# Patient Record
Sex: Female | Born: 1965 | Race: Black or African American | Hispanic: No | Marital: Married | Smoking: Never smoker
Health system: Southern US, Community
[De-identification: ages and names within clinical notes are randomized; demographics above are authoritative.]

## PROBLEM LIST (undated history)

## (undated) DIAGNOSIS — G43909 Migraine, unspecified, not intractable, without status migrainosus: Secondary | ICD-10-CM

## (undated) DIAGNOSIS — M1990 Unspecified osteoarthritis, unspecified site: Secondary | ICD-10-CM

## (undated) DIAGNOSIS — I10 Essential (primary) hypertension: Secondary | ICD-10-CM

## (undated) DIAGNOSIS — E669 Obesity, unspecified: Secondary | ICD-10-CM

## (undated) DIAGNOSIS — M1712 Unilateral primary osteoarthritis, left knee: Secondary | ICD-10-CM

## (undated) HISTORY — DX: Obesity, unspecified: E66.9

## (undated) HISTORY — DX: Essential (primary) hypertension: I10

## (undated) HISTORY — PX: ABDOMINAL HYSTERECTOMY: SHX81

---
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
## 2004-12-19 ENCOUNTER — Emergency Department (HOSPITAL_COMMUNITY): Admission: EM | Admit: 2004-12-19 | Discharge: 2004-12-19 | Payer: Self-pay | Admitting: Family Medicine

## 2006-08-31 ENCOUNTER — Ambulatory Visit (HOSPITAL_COMMUNITY): Admission: RE | Admit: 2006-08-31 | Discharge: 2006-08-31 | Payer: Self-pay | Admitting: Obstetrics & Gynecology

## 2007-06-10 ENCOUNTER — Ambulatory Visit: Payer: Self-pay

## 2008-03-13 ENCOUNTER — Emergency Department (HOSPITAL_COMMUNITY): Admission: EM | Admit: 2008-03-13 | Discharge: 2008-03-13 | Payer: Self-pay | Admitting: Family Medicine

## 2008-07-14 ENCOUNTER — Emergency Department (HOSPITAL_COMMUNITY): Admission: EM | Admit: 2008-07-14 | Discharge: 2008-07-14 | Payer: Self-pay | Admitting: Family Medicine

## 2010-06-09 ENCOUNTER — Encounter: Payer: Self-pay | Admitting: Obstetrics & Gynecology

## 2011-02-18 LAB — GLUCOSE, CAPILLARY: Glucose-Capillary: 76

## 2011-02-18 LAB — POCT CARDIAC MARKERS
CKMB, poc: 1.1
CKMB, poc: <1 — ABNORMAL LOW
Myoglobin, poc: 53.7
Myoglobin, poc: 62.3

## 2011-02-18 LAB — CBC
HCT: 38.9
Hemoglobin: 13.3
MCV: 84
Platelets: 355

## 2011-02-18 LAB — COMPREHENSIVE METABOLIC PANEL
Albumin: 3.9
Alkaline Phosphatase: 65
BUN: 13
Calcium: 9.5
Chloride: 100
Creatinine, Ser: 0.94
GFR calc Af Amer: >60
Glucose, Bld: 81
Potassium: 4.5
Sodium: 133 — ABNORMAL LOW
Total Bilirubin: 0.7

## 2011-02-18 LAB — DIFFERENTIAL
Eosinophils Relative: 0
Lymphocytes Relative: 51 — ABNORMAL HIGH
Lymphs Abs: 1.8

## 2012-11-03 ENCOUNTER — Other Ambulatory Visit: Payer: Self-pay | Admitting: Family Medicine

## 2012-11-03 DIAGNOSIS — M25561 Pain in right knee: Secondary | ICD-10-CM

## 2012-11-11 ENCOUNTER — Ambulatory Visit
Admission: RE | Admit: 2012-11-11 | Discharge: 2012-11-11 | Disposition: A | Discharge status: Home / Self Care | Payer: 59 | Source: Ambulatory Visit | Attending: Family Medicine | Admitting: Family Medicine

## 2012-11-11 DIAGNOSIS — M25561 Pain in right knee: Secondary | ICD-10-CM

## 2012-12-28 ENCOUNTER — Other Ambulatory Visit: Payer: Self-pay | Admitting: Obstetrics & Gynecology

## 2012-12-28 DIAGNOSIS — Z139 Encounter for screening, unspecified: Secondary | ICD-10-CM

## 2013-01-13 ENCOUNTER — Ambulatory Visit (INDEPENDENT_AMBULATORY_CARE_PROVIDER_SITE_OTHER): Payer: 59 | Admitting: Obstetrics & Gynecology

## 2013-01-13 ENCOUNTER — Encounter: Payer: Self-pay | Admitting: Obstetrics & Gynecology

## 2013-01-13 ENCOUNTER — Ambulatory Visit (HOSPITAL_COMMUNITY)
Admission: RE | Admit: 2013-01-13 | Discharge: 2013-01-13 | Disposition: A | Discharge status: Home / Self Care | Payer: 59 | Source: Ambulatory Visit | Attending: Obstetrics & Gynecology | Admitting: Obstetrics & Gynecology

## 2013-01-13 VITALS — BP 120/80 | Ht 63.0 in | Wt 249.0 lb

## 2013-01-13 DIAGNOSIS — Z01419 Encounter for gynecological examination (general) (routine) without abnormal findings: Secondary | ICD-10-CM

## 2013-01-13 DIAGNOSIS — Z139 Encounter for screening, unspecified: Secondary | ICD-10-CM

## 2013-01-13 DIAGNOSIS — Z1231 Encounter for screening mammogram for malignant neoplasm of breast: Secondary | ICD-10-CM | POA: Insufficient documentation

## 2013-01-13 NOTE — Progress Notes (Signed)
Patient ID: Tamara Malone, female   DOB: 12/15/1965, 47 y.o.   MRN: [id number] Subjective:     Tamara Malone is a 47 y.o. female here for a routine exam.  No LMP recorded. Patient has had a hysterectomy. No obstetric history on file. Current complaints: none.  Personal health questionnaire reviewed: no.   Gynecologic History No LMP recorded. Patient has had a hysterectomy. Contraception: status post hysterectomy Last Pap: 2001. Results were: na Last mammogram: 2014, today. Results were: pending  Obstetric History OB History  No data available     The following portions of the patient's history were reviewed and updated as appropriate: allergies, current medications, past family history, past medical history, past social history, past surgical history and problem list.  Review of Systems  Review of Systems  Constitutional: Negative for fever, chills, weight loss, malaise/fatigue and diaphoresis.  HENT: Negative for hearing loss, ear pain, nosebleeds, congestion, sore throat, neck pain, tinnitus and ear discharge.   Eyes: Negative for blurred vision, double vision, photophobia, pain, discharge and redness.  Respiratory: Negative for cough, hemoptysis, sputum production, shortness of breath, wheezing and stridor.   Cardiovascular: Negative for chest pain, palpitations, orthopnea, claudication, leg swelling and PND.  Gastrointestinal: negative for abdominal pain. Negative for heartburn, nausea, vomiting, diarrhea, constipation, blood in stool and melena.  Genitourinary: Negative for dysuria, urgency, frequency, hematuria and flank pain.  Musculoskeletal: Negative for myalgias, back pain, joint pain and falls.  Skin: Negative for itching and rash.  Neurological: Negative for dizziness, tingling, tremors, sensory change, speech change, focal weakness, seizures, loss of consciousness, weakness and headaches.  Endo/Heme/Allergies: Negative for environmental allergies and polydipsia. Does  not bruise/bleed easily.  Psychiatric/Behavioral: Negative for depression, suicidal ideas, hallucinations, memory loss and substance abuse. The patient is not nervous/anxious and does not have insomnia.        Objective:    Physical Exam  Vitals reviewed. Constitutional: She is oriented to person, place, and time. She appears well-developed and well-nourished.  HENT:  Head: Normocephalic and atraumatic.        Right Ear: External ear normal.  Left Ear: External ear normal.  Nose: Nose normal.  Mouth/Throat: Oropharynx is clear and moist.  Eyes: Conjunctivae and EOM are normal. Pupils are equal, round, and reactive to light. Right eye exhibits no discharge. Left eye exhibits no discharge. No scleral icterus.  Neck: Normal range of motion. Neck supple. No tracheal deviation present. No thyromegaly present.  Cardiovascular: Normal rate, regular rhythm, normal heart sounds and intact distal pulses.  Exam reveals no gallop and no friction rub.   No murmur heard. Respiratory: Effort normal and breath sounds normal. No respiratory distress. She has no wheezes. She has no rales. She exhibits no tenderness.  GI: Soft. Bowel sounds are normal. She exhibits no distension and no mass. There is no tenderness. There is no rebound and no guarding.  Genitourinary:       Vulva is normal without lesions Vagina is pink moist without discharge Cervix absent Uterus is absent Adnexa is negative with normal sized ovaries   Musculoskeletal: Normal range of motion. She exhibits no edema and no tenderness.  Neurological: She is alert and oriented to person, place, and time. She has normal reflexes. She displays normal reflexes. No cranial nerve deficit. She exhibits normal muscle tone. Coordination normal.  Skin: Skin is warm and dry. No rash noted. No erythema. No pallor.  Psychiatric: She has a normal mood and affect. Her behavior is normal. Judgment and  thought content normal.       Assessment:     Healthy female exam.    Plan:    Mammogram ordered. Follow up in: 1 year.

## 2014-07-13 ENCOUNTER — Encounter: Payer: 59 | Attending: Internal Medicine | Admitting: Dietician

## 2014-07-13 ENCOUNTER — Encounter: Payer: Self-pay | Admitting: Dietician

## 2014-07-13 DIAGNOSIS — Z6841 Body Mass Index (BMI) 40.0 and over, adult: Secondary | ICD-10-CM | POA: Diagnosis not present

## 2014-07-13 DIAGNOSIS — Z713 Dietary counseling and surveillance: Secondary | ICD-10-CM | POA: Insufficient documentation

## 2014-07-13 DIAGNOSIS — E669 Obesity, unspecified: Secondary | ICD-10-CM | POA: Diagnosis present

## 2014-07-13 NOTE — Patient Instructions (Addendum)
-  Eat 3 meals a day. -Try to eat something first thing in the morning: boiled eggs, protein shake (Premier protein, EASadvantage, Atkins), yogurt -Have protein with every meal and snack: boiled eggs, cheese, sticks, yogurt, nuts -Fill up on non-starchy vegetables -Have salad dressings on the side.  -Pre-portion snacks into small containers or ziploc bags and put away the container.  -For snacks, try non-starchy vegetables: broccoli, cauliflower, cucumbers, tomatoes with hummus or ranch made with greek yogurt and [employer]. -Try replacing mayonnaise with greek yogurt for chicken salad -Keep protein and starch servings about the size of the palm of your hand -Use a smaller plate  -Be as active as you can, start small!

## 2014-07-13 NOTE — Progress Notes (Signed)
  Medical Nutrition Therapy:  Appt start time: 215 end time:  250.   Assessment:  Primary concerns today: Kirstin is here today with the desire to lose weight. About 3 years ago, she states she focused on vegetables, fruit, and water, maybe splurging on weekends but lost about 30 lbs. She works second shift. She lives at home with her husband. She does the grocery shopping and meal preparation. She states she eats take out about three times a week, usually [employer]-fil[employer]a. She states she may skip 10 meals in a typical week, always breakfast.  She says she doesn't have much of an appetite during the day and usually eats once a day about 6:00pm. She has a treadmill at home but does not like to use it. She is thinking about joining a gym. She states she has had blood pressure problems in the past but does not currently.   Preferred Learning Style:   No preference indicated   Learning Readiness:   Contemplating   MEDICATIONS: see list   DIETARY INTAKE:  Usual eating pattern includes 1 meals and 1 snack per day.  Avoided foods include carrots, celery.    24-hr recall:  B ( AM): usually skips, sometimes bacon and eggs on the weekends  Snk ( AM): none L ( PM): skips Snk ( PM): none D ( 6 PM): lasagna, chicken salad, salad with tomatoes, cheese, and grilled chicken with [place] dressing Snk ( PM): peanut butter pretzels, sun chips Beverages: water  Usual physical activity: none  Estimated energy needs: 1800 calories 200 g carbohydrates 135 g protein 50 g fat  Progress Towards Goal(s):  In progress.   Nutritional Diagnosis:  NB-1.1 Food and nutrition-related knowledge deficit As related to meal skipping and inappropriate food choices.  As evidenced by diet recall.    Intervention:  Nutrition education provided. Encourage patient to eat 3 meals a day.  Goals: -Eat 3 meals a day. -Try to eat something first thing in the morning: boiled eggs, protein shake (Premier protein,  EASadvantage, Atkins), yogurt -Have protein with every meal and snack: boiled eggs, cheese, sticks, yogurt, nuts -Fill up on non-starchy vegetables -Have salad dressings on the side.  -Pre-portion snacks into small containers or ziploc bags and put away the container.  -For snacks, try non-starchy vegetables: broccoli, cauliflower, cucumbers, tomatoes with hummus or ranch made with greek yogurt and [employer]. -Try replacing mayonnaise with greek yogurt for chicken salad -Keep protein and starch servings about the size of the palm of your hand -Use a smaller plate  -Be as active as you can, start small!   Teaching Method Utilized:  Visual Auditory Hands on  Handouts given during visit include:  Meal planning card  My Plate  Snack list  Barriers to learning/adherence to lifestyle change: patient works second shift  Demonstrated degree of understanding via:  Teach Back   Monitoring/Evaluation:  Dietary intake, exercise, and body weight prn.

## 2015-11-29 ENCOUNTER — Other Ambulatory Visit: Payer: Self-pay | Admitting: Orthopedic Surgery

## 2015-11-29 DIAGNOSIS — M25571 Pain in right ankle and joints of right foot: Secondary | ICD-10-CM

## 2015-12-10 ENCOUNTER — Ambulatory Visit
Admission: RE | Admit: 2015-12-10 | Discharge: 2015-12-10 | Disposition: A | Discharge status: Home / Self Care | Payer: 59 | Source: Ambulatory Visit | Attending: Orthopedic Surgery | Admitting: Orthopedic Surgery

## 2015-12-10 DIAGNOSIS — M25571 Pain in right ankle and joints of right foot: Secondary | ICD-10-CM

## 2016-01-03 ENCOUNTER — Ambulatory Visit (INDEPENDENT_AMBULATORY_CARE_PROVIDER_SITE_OTHER): Payer: 59 | Admitting: Podiatry

## 2016-01-03 ENCOUNTER — Encounter: Payer: Self-pay | Admitting: Podiatry

## 2016-01-03 ENCOUNTER — Ambulatory Visit (INDEPENDENT_AMBULATORY_CARE_PROVIDER_SITE_OTHER): Payer: 59

## 2016-01-03 ENCOUNTER — Ambulatory Visit: Payer: Self-pay

## 2016-01-03 VITALS — BP 139/88 | HR 90 | Resp 16 | Ht 64.0 in | Wt 265.0 lb

## 2016-01-03 DIAGNOSIS — M779 Enthesopathy, unspecified: Secondary | ICD-10-CM

## 2016-01-03 DIAGNOSIS — M79672 Pain in left foot: Secondary | ICD-10-CM

## 2016-01-03 DIAGNOSIS — M25571 Pain in right ankle and joints of right foot: Secondary | ICD-10-CM | POA: Diagnosis not present

## 2016-01-03 MED ORDER — TRIAMCINOLONE ACETONIDE 10 MG/ML IJ SUSP
10.0000 mg | Freq: Once | INTRAMUSCULAR | Status: AC
  Administered 2016-01-03: 10 mg

## 2016-01-03 NOTE — Progress Notes (Signed)
   Subjective:    Patient ID: Tamara Malone, female    DOB: Feb 16, 1966, 50 y.o.   MRN: [id number]  HPI Chief Complaint  Patient presents with  . Ankle Pain    Right foot; medial side; pt stated, "Pain radiates from the medial side to underneath the foot in the arch area"; x2 months   Pt saw Dr. Sharol Given from [facility] on December 20, 2015 and was given a boot and diclofenac cream for foot pain. Neither one of these are working for the patient. Pt was told by doctor that she had, "A torn tendon".    Review of Systems     Objective:   Physical Exam        Assessment & Plan:

## 2016-01-04 NOTE — Progress Notes (Signed)
Subjective:     Patient ID: Tamara Malone, female   DOB: 04-13-1966, 50 y.o.   MRN: [id number]  HPI patient points to the right foot stating the inside of the ankle has been very sore and it's been going on for several months. sHe is seen another physician that is not been helpful and she states that she has difficulty working.   Review of Systems  All other systems reviewed and are negative.      Objective:   Physical Exam  Constitutional: She is oriented to person, place, and time.  Cardiovascular: Intact distal pulses.   Musculoskeletal: Normal range of motion.  Neurological: She is oriented to person, place, and time.  Skin: Skin is warm.  Nursing note and vitals reviewed.  neurovascular status intact muscle strength was found to be adequate and posterior tibial muscle right was found to be intact with inflammation and pain of the posterior tibial tendon around 3 cm proximal to the navicular and inserting into this bone. 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 Patient had quite a bit of discomfort when the areas pressed and does have good digital perfusion and is well oriented 3     Assessment:     Inflammatory tendinitis with fluid buildup around the posterior tibial tendon with foot function is the major cause of this problem    Plan:     H&P and education rendered concerning condition. Today I went ahead and did a sheath injection of posterior tib and applied a fascial brace to lift the arch and discussed on using her boot at home. Her MRI does indicate tenosynovium right is we will need to watch this and it is ultimately possible she will need some kind of AFO brace and also may require surgery if symptoms persist. I did also today go ahead and cast her for a Berkley type orthotic with deep heel seat and 10 of lift to try to take stress off the tendon  X-ray report indicates that there is significant  collapse medial longitudinal arch right over left with quite a bit of talar declination angle increase

## 2016-01-24 ENCOUNTER — Encounter: Payer: Self-pay | Admitting: Podiatry

## 2016-01-24 ENCOUNTER — Ambulatory Visit (INDEPENDENT_AMBULATORY_CARE_PROVIDER_SITE_OTHER): Payer: 59 | Admitting: Podiatry

## 2016-01-24 DIAGNOSIS — M779 Enthesopathy, unspecified: Secondary | ICD-10-CM | POA: Diagnosis not present

## 2016-01-24 NOTE — Patient Instructions (Signed)

## 2016-01-27 NOTE — Progress Notes (Signed)
Subjective:     Patient ID: Tamara Malone, female   DOB: May 20, 1965, 50 y.o.   MRN: [id number]  HPI patient states that I'm feeling somewhat better with continued discomfort if I been walking too much   Review of Systems     Objective:   Physical Exam Neurovascular status intact with continued tendinitis that has improved moderately with mild discomfort upon deep palpation    Assessment:     Improving tendinitis with pain still present    Plan:     Dispensed orthotics with instructions on usage advised on ice and reappoint to recheck again in the next 4 weeks

## 2016-02-15 ENCOUNTER — Other Ambulatory Visit: Payer: Self-pay | Admitting: Internal Medicine

## 2016-02-15 DIAGNOSIS — G43009 Migraine without aura, not intractable, without status migrainosus: Secondary | ICD-10-CM

## 2016-02-21 ENCOUNTER — Ambulatory Visit (INDEPENDENT_AMBULATORY_CARE_PROVIDER_SITE_OTHER): Payer: 59 | Admitting: Podiatry

## 2016-02-21 ENCOUNTER — Encounter: Payer: Self-pay | Admitting: Podiatry

## 2016-02-21 DIAGNOSIS — M25571 Pain in right ankle and joints of right foot: Secondary | ICD-10-CM | POA: Diagnosis not present

## 2016-02-21 DIAGNOSIS — M779 Enthesopathy, unspecified: Secondary | ICD-10-CM

## 2016-02-24 NOTE — Progress Notes (Signed)
Subjective:     Patient ID: Tamara Malone, female   DOB: 10/16/1965, 50 y.o.   MRN: [id number]  HPI patient presents stating I'm feeling quite a bit better with minimal discomfort   Review of Systems     Objective:   Physical Exam Neurovascular status intact with minimal discomfort plantar heel right with inflammation still present but localized in nature    Assessment:     Plantar fasciitis right improved but present    Plan:     Instructed on the importance of physical therapy shoe gear modifications and choosing activities appropriately. Continued diclofenac gel as needed and reappoint as symptoms indicate

## 2016-03-08 ENCOUNTER — Ambulatory Visit
Admission: RE | Admit: 2016-03-08 | Discharge: 2016-03-08 | Disposition: A | Discharge status: Home / Self Care | Payer: 59 | Source: Ambulatory Visit | Attending: Internal Medicine | Admitting: Internal Medicine

## 2016-03-08 DIAGNOSIS — G43009 Migraine without aura, not intractable, without status migrainosus: Secondary | ICD-10-CM

## 2016-03-08 MED ORDER — GADOBENATE DIMEGLUMINE 529 MG/ML IV SOLN
20.0000 mL | Freq: Once | INTRAVENOUS | Status: AC | PRN
  Administered 2016-03-08: 20 mL via INTRAVENOUS

## 2016-03-17 ENCOUNTER — Ambulatory Visit
Admission: RE | Admit: 2016-03-17 | Discharge: 2016-03-17 | Disposition: A | Discharge status: Home / Self Care | Payer: Self-pay | Source: Ambulatory Visit | Attending: Internal Medicine | Admitting: Internal Medicine

## 2016-03-17 ENCOUNTER — Other Ambulatory Visit: Payer: Self-pay | Admitting: Internal Medicine

## 2016-03-17 DIAGNOSIS — R52 Pain, unspecified: Secondary | ICD-10-CM

## 2016-03-19 ENCOUNTER — Encounter: Payer: Self-pay | Admitting: Gastroenterology

## 2016-05-23 ENCOUNTER — Ambulatory Visit (AMBULATORY_SURGERY_CENTER): Payer: Self-pay | Admitting: *Deleted

## 2016-05-23 ENCOUNTER — Other Ambulatory Visit: Payer: Self-pay

## 2016-05-23 VITALS — Ht 64.0 in | Wt 269.2 lb

## 2016-05-23 DIAGNOSIS — Z1211 Encounter for screening for malignant neoplasm of colon: Secondary | ICD-10-CM

## 2016-05-23 MED ORDER — NA SULFATE-K SULFATE-MG SULF 17.5-3.13-1.6 GM/177ML PO SOLN: ORAL | 0 refills | Status: DC

## 2016-05-23 NOTE — Progress Notes (Signed)
Pt denies allergies to eggs or soy products. Denies difficulty with sedation or anesthesia. Denies any diet or weight loss medications. Denies use of supplemental oxygen.  Emmi instructions given for procedure.  

## 2016-06-06 ENCOUNTER — Encounter: Payer: Self-pay | Admitting: Gastroenterology

## 2016-06-06 ENCOUNTER — Ambulatory Visit (AMBULATORY_SURGERY_CENTER): Payer: 59 | Admitting: Gastroenterology

## 2016-06-06 VITALS — BP 136/87 | HR 64 | Temp 97.1°F | Resp 10 | Ht 64.0 in | Wt 269.0 lb

## 2016-06-06 DIAGNOSIS — K621 Rectal polyp: Secondary | ICD-10-CM

## 2016-06-06 DIAGNOSIS — Z1211 Encounter for screening for malignant neoplasm of colon: Secondary | ICD-10-CM

## 2016-06-06 DIAGNOSIS — D128 Benign neoplasm of rectum: Secondary | ICD-10-CM

## 2016-06-06 DIAGNOSIS — Z1212 Encounter for screening for malignant neoplasm of rectum: Secondary | ICD-10-CM

## 2016-06-06 MED ORDER — SODIUM CHLORIDE 0.9 % IV SOLN: 500.0000 mL | INTRAVENOUS | Status: DC

## 2016-06-06 NOTE — Patient Instructions (Signed)
YOU HAD AN ENDOSCOPIC PROCEDURE TODAY AT [facility]:   Refer to the procedure report that was given to you for any specific questions about what was found during the examination.  If the procedure report does not answer your questions, please call your gastroenterologist to clarify.  If you requested that your care partner not be given the details of your procedure findings, then the procedure report has been included in a sealed envelope for you to review at your convenience later.  YOU SHOULD EXPECT: Some feelings of bloating in the abdomen. Passage of more gas than usual.  Walking can help get rid of the air that was put into your GI tract during the procedure and reduce the bloating. If you had a lower endoscopy (such as a colonoscopy or flexible sigmoidoscopy) you may notice spotting of blood in your stool or on the toilet paper. If you underwent a bowel prep for your procedure, you may not have a normal bowel movement for a few days.  Please Note:  You might notice some irritation and congestion in your nose or some drainage.  This is from the oxygen used during your procedure.  There is no need for concern and it should clear up in a day or so.  SYMPTOMS TO REPORT IMMEDIATELY:   Following lower endoscopy (colonoscopy or flexible sigmoidoscopy):  Excessive amounts of blood in the stool  Significant tenderness or worsening of abdominal pains  Swelling of the abdomen that is new, acute  Fever of 100F or higher   Following upper endoscopy (EGD)  Vomiting of blood or coffee ground material  New chest pain or pain under the shoulder blades  Painful or persistently difficult swallowing  New shortness of breath  Fever of 100F or higher  Black, tarry-looking stools  For urgent or emergent issues, a gastroenterologist can be reached at any hour by calling [phone].   DIET:  We do recommend a small meal at first, but then you may proceed to your regular diet.  Drink  plenty of fluids but you should avoid alcoholic beverages for 24 hours.  ACTIVITY:  You should plan to take it easy for the rest of today and you should NOT DRIVE or use heavy machinery until tomorrow (because of the sedation medicines used during the test).    FOLLOW UP: Our staff will call the number listed on your records the next business day following your procedure to check on you and address any questions or concerns that you may have regarding the information given to you following your procedure. If we do not reach you, we will leave a message.  However, if you are feeling well and you are not experiencing any problems, there is no need to return our call.  We will assume that you have returned to your regular daily activities without incident.  If any biopsies were taken you will be contacted by phone or by letter within the next 1-3 weeks.  Please call us at [phone] if you have not heard about the biopsies in 3 weeks.    SIGNATURES/CONFIDENTIALITY: You and/or your care partner have signed paperwork which will be entered into your electronic medical record.  These signatures attest to the fact that that the information above on your After Visit Summary has been reviewed and is understood.  Full responsibility of the confidentiality of this discharge information lies with you and/or your care-partner.    Handouts were given to your care partner on polyps  and hemorrhoids. You may resume your current medications today. Await biopsy results. Please call if any questions or concerns.

## 2016-06-06 NOTE — Op Note (Signed)
[facility] Patient Name: Tamara Malone Procedure Date: 06/06/2016 11:40 AM MRN: [id number] Endoscopist: Mallie Mussel L. Loletha Carrow , MD Age: 51 Referring MD:  Date of Birth: 1965-12-14 Gender: Female Account #: [id number] Procedure:                Colonoscopy Indications:              Screening for colorectal malignant neoplasm, This                            is the patient's first colonoscopy Medicines:                Monitored Anesthesia Care Procedure:                Pre-Anesthesia Assessment:                           - Prior to the procedure, a History and Physical                            was performed, and patient medications and                            allergies were reviewed. The patient's tolerance of                            previous anesthesia was also reviewed. The risks                            and benefits of the procedure and the sedation                            options and risks were discussed with the patient.                            All questions were answered, and informed consent                            was obtained. Prior Anticoagulants: The patient has                            taken no previous anticoagulant or antiplatelet                            agents. ASA Grade Assessment: II - A patient with                            mild systemic disease. After reviewing the risks                            and benefits, the patient was deemed in                            satisfactory condition to undergo the procedure.  After obtaining informed consent, the colonoscope                            was passed under direct vision. Throughout the                            procedure, the patient's blood pressure, pulse, and                            oxygen saturations were monitored continuously. The                            Model PCF-H190L [id number]) scope was introduced                            through the anus and  advanced to the the cecum,                            identified by appendiceal orifice and ileocecal                            valve. The colonoscopy was performed without                            difficulty. The patient tolerated the procedure                            well. The quality of the bowel preparation was                            excellent. The ileocecal valve, appendiceal                            orifice, and rectum were photographed. The quality                            of the bowel preparation was evaluated using the                            BBPS [facility] Bowel Preparation Scale) with scores                            of: Right Colon = 2, Transverse Colon = 3 and Left                            Colon = 3. The total BBPS score equals 8. The bowel                            preparation used was SUPREP. Scope In: [id number] AM Scope Out: 12:03:09 PM Scope Withdrawal Time: 0 hours 14 minutes 51 seconds  Total Procedure Duration: 0 hours 16 minutes 50 seconds  Findings:                 The perianal  and digital rectal examinations were                            normal.                           Two sessile polyps were found in the rectum. The                            polyps were 2 to 4 mm in size. These polyps were                            removed with a cold snare. Resection and retrieval                            were complete.                           Internal hemorrhoids were found during retroflexion                            and during anoscopy. The hemorrhoids were small and                            Grade I (internal hemorrhoids that do not prolapse).                           The exam was otherwise without abnormality on                            direct and retroflexion views. Complications:            No immediate complications. Estimated Blood Loss:     Estimated blood loss: none. Impression:               - Two 2 to 4 mm polyps in the rectum,  removed with                            a cold snare. Resected and retrieved.                           - Internal hemorrhoids.                           - The examination was otherwise normal on direct                            and retroflexion views. Recommendation:           - Patient has a contact number available for                            emergencies. The signs and symptoms of potential                            delayed complications were discussed  with the                            patient. Return to normal activities tomorrow.                            Written discharge instructions were provided to the                            patient.                           - Resume previous diet.                           - Continue present medications.                           - Await pathology results.                           - Repeat colonoscopy is recommended for                            surveillance. The colonoscopy date will be                            determined after pathology results from today's                            exam become available for review. Henry L. Loletha Carrow, MD 06/06/2016 12:07:05 PM This report has been signed electronically.

## 2016-06-06 NOTE — Progress Notes (Signed)
To recovery vss report to RN 

## 2016-06-06 NOTE — Progress Notes (Signed)
Called to room to assist during endoscopic procedure.  Patient ID and intended procedure confirmed with present staff. Received instructions for my participation in the procedure from the performing physician.  

## 2016-06-06 NOTE — Progress Notes (Signed)
No problems noted in the recovery room. maw 

## 2016-06-09 ENCOUNTER — Telehealth: Payer: Self-pay | Admitting: *Deleted

## 2016-06-09 ENCOUNTER — Telehealth: Payer: Self-pay

## 2016-06-09 NOTE — Telephone Encounter (Signed)
No answer left message will attempt to call back later this afternoon. SM 

## 2016-06-09 NOTE — Telephone Encounter (Signed)
  Follow up Call-  Call back number 06/06/2016  Post procedure Call Back phone  # [phone]  Permission to leave phone message Yes  Some recent data might be hidden    Patient does not have concerns about her care. Yes was checked in error.   Patient questions:  Do you have a fever, pain , or abdominal swelling? No. Pain Score  0 *  Have you tolerated food without any problems? Yes.    Have you been able to return to your normal activities? Yes.    Do you have any questions about your discharge instructions: Diet   No. Medications  No. Follow up visit  No.  Do you have questions or concerns about your Care? Yes.    Actions: * If pain score is 4 or above: No action needed, pain <4.

## 2016-06-10 ENCOUNTER — Encounter: Payer: Self-pay | Admitting: Gastroenterology

## 2016-06-15 ENCOUNTER — Encounter: Payer: Self-pay | Admitting: Gastroenterology

## 2016-12-14 IMAGING — MR MR ANKLE*R* W/O CM
5 series · 40 of 40 positions shown · non-contrast
Comparison: Radiographs dated 11/28/2015

CLINICAL DATA: Medial right heel and arch pain and swelling for 5
weeks.

EXAM:
MRI OF THE RIGHT ANKLE WITHOUT CONTRAST
TECHNIQUE: Multiplanar, multisequence MR imaging of the ankle was performed. No
intravenous contrast was administered.

[Series 4: T2 fat-sat · axial · 3.0mm · 0.56mm/px · z∈[-90,+33]mm · 9 of 33 slices shown (1 of 3)]
[im 1/33]
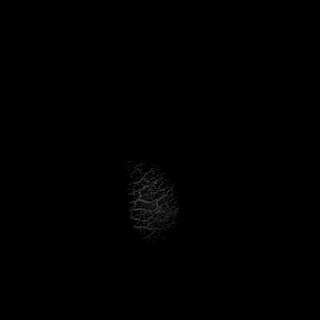
[im 5/33]
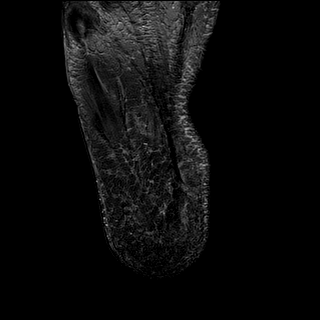
[im 9/33]
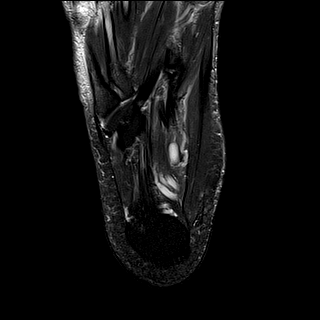
[im 13/33]
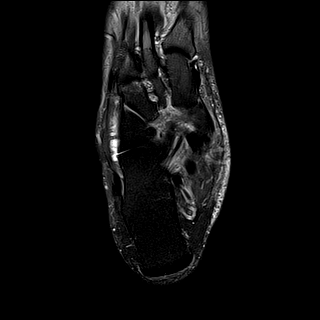
[im 17/33]
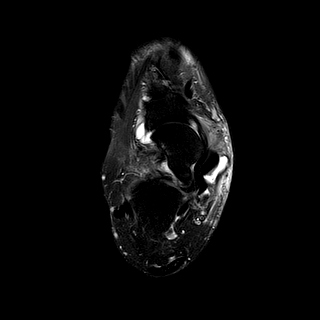
[im 21/33]
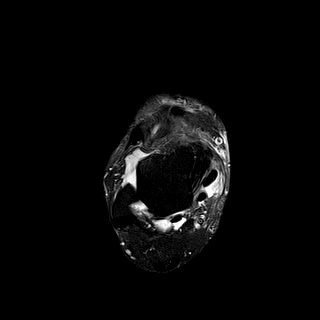
[im 25/33]
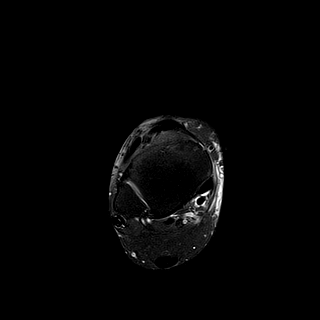
[im 29/33]
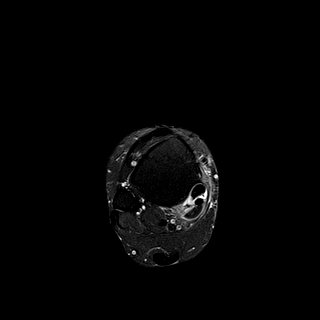
[im 33/33]
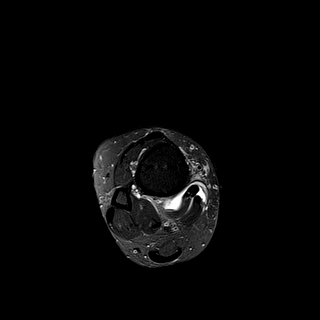

[Series 6: T1 · sagittal · 3.0mm · 0.56mm/px · 7 of 25 slices shown]
[im 1/25]
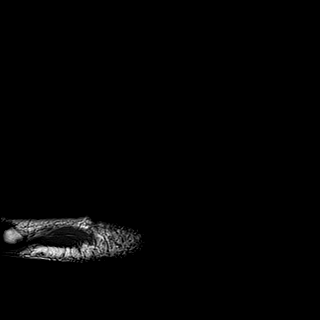
[im 5/25]
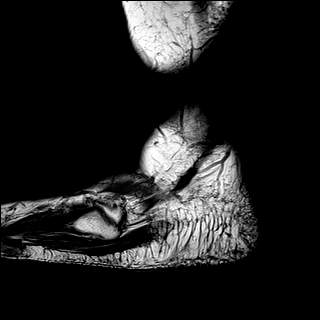
[im 9/25]
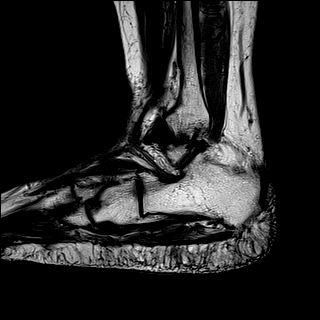
[im 13/25]
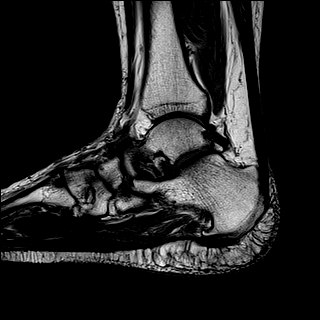
[im 17/25]
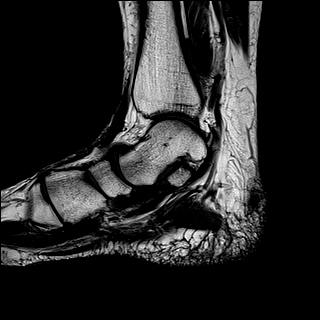
[im 21/25]
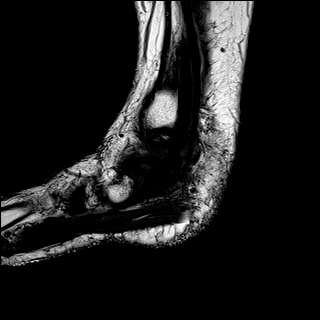
[im 25/25]
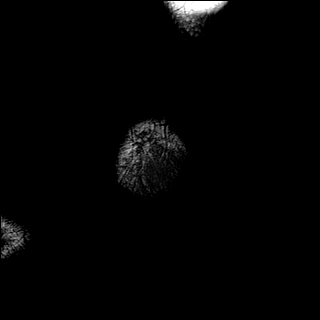

[Series 7: T2 fat-sat · sagittal · 3.0mm · 0.56mm/px · 7 of 25 slices shown (2 of 3)]
[im 1/25]
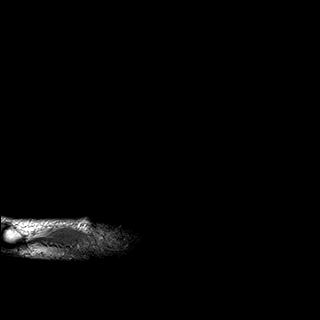
[im 5/25]
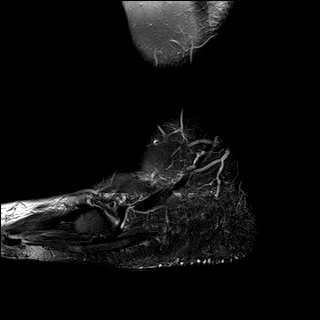
[im 9/25]
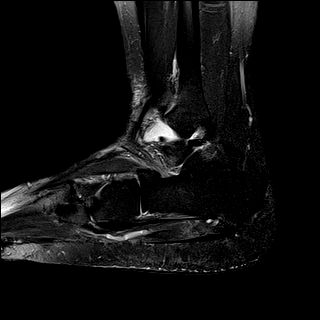
[im 13/25]
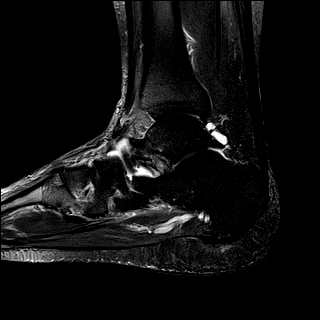
[im 17/25]
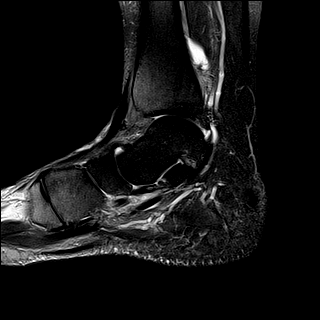
[im 21/25]
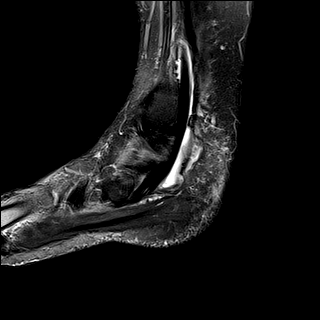
[im 25/25]
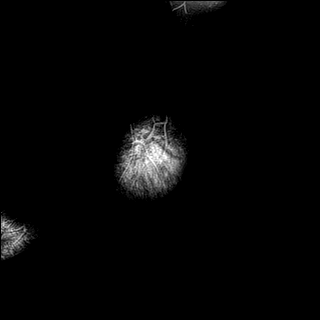

[Series 8: T2 fat-sat · coronal · 3.5mm · 0.56mm/px · 8 of 28 slices shown (3 of 3)]
[im 1/28]
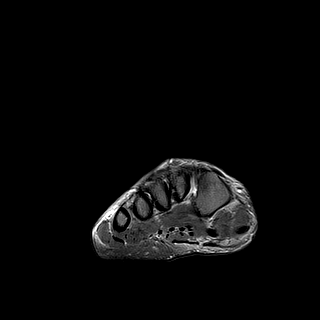
[im 4/28]
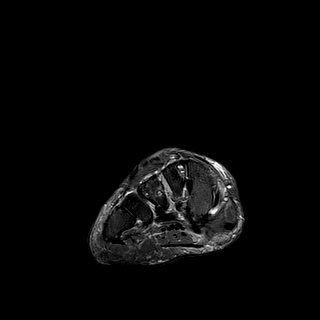
[im 8/28]
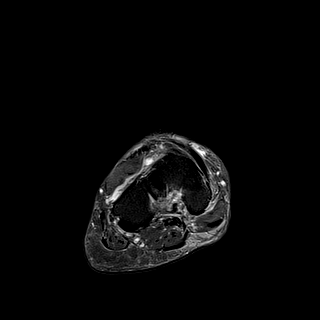
[im 12/28]
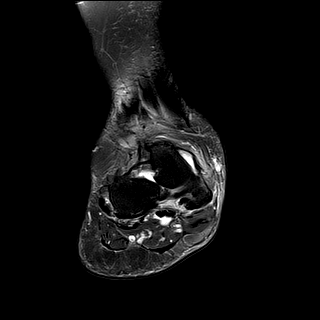
[im 16/28]
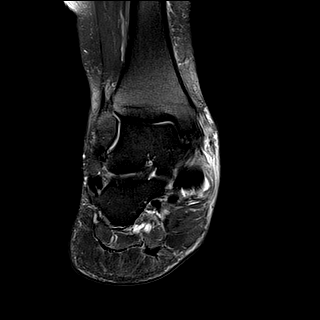
[im 20/28]
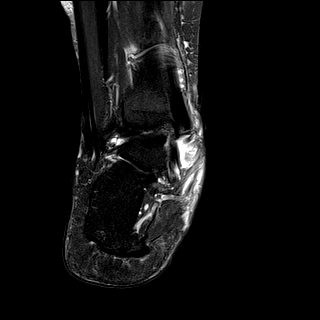
[im 24/28]
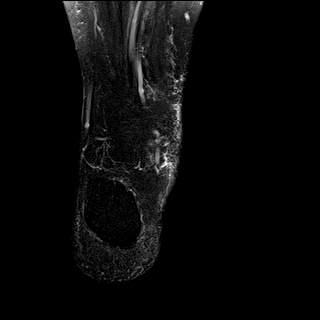
[im 28/28]
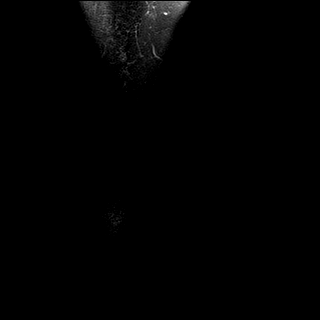

[Series 9: PD fat-sat · axial · 3.0mm · 0.56mm/px · z∈[-90,+33]mm · 9 of 33 slices shown]
[im 1/33]
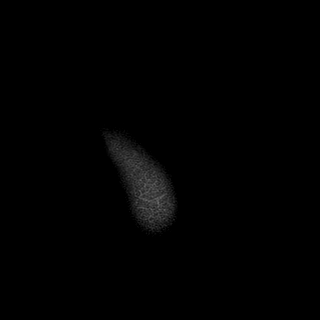
[im 5/33]
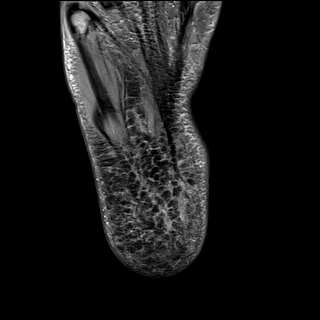
[im 9/33]
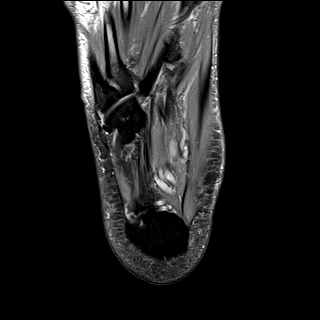
[im 13/33]
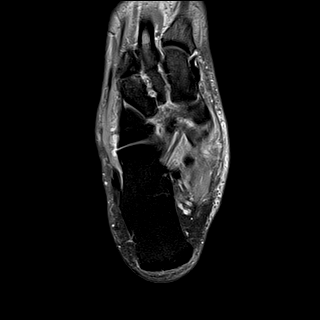
[im 17/33]
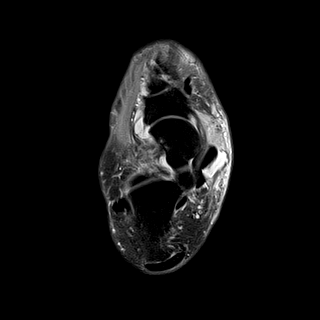
[im 21/33]
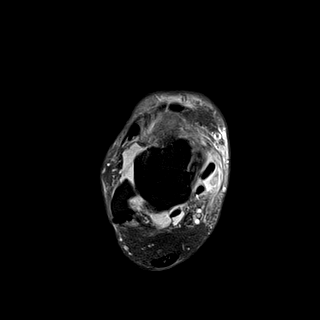
[im 25/33]
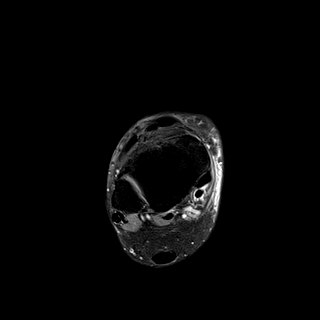
[im 29/33]
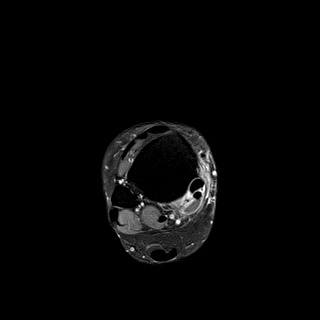
[im 33/33]
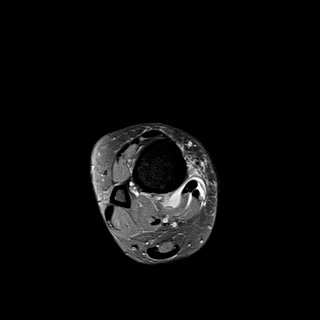

[40 of 40 positions shown; findings below may reference images not displayed]

FINDINGS: TENDONS

Peroneal: Normal.

Posteromedial: Marked tenosynovitis of the posterior tibialis
tendon. The tendon is intact there is extensive fluid and some
debris in the tendon sheath. Small amount of fluid in the sheath of
the flexor digitorum longus tendon. Flexor hallucis longus appears
normal.

Anterior: Normal.

Achilles: Minimal intrinsic degeneration of the distal Achilles
tendon.

Plantar Fascia: Slight thickening of the proximal medial band of the
plantar fascia. No active inflammation.

LIGAMENTS

Lateral: Normal.

Medial: Thickened but intact. Old small avulsions from the tip of
the medial malleolus.

CARTILAGE

Ankle Joint: Small ankle effusion.

Subtalar Joints/Sinus Tarsi: Small subtalar joint effusion.
Ligaments of the sinus tarsi are intact.

Bones: Slight osteoarthritis of the dorsal aspect of the first,
second and third tarsal metatarsal joints.

Other: None
IMPRESSION: Prominent tenosynovitis of the posterior tibialis tendon.

Chronic posttraumatic changes of the deltoid ligament.  Low

## 2019-01-21 NOTE — Patient Instructions (Addendum)
DUE TO COVID-19 ONLY ONE VISITOR IS ALLOWED TO COME WITH YOU AND STAY IN THE WAITING ROOM ONLY DURING PRE OP AND PROCEDURE DAY OF SURGERY. THE 1 VISITOR MAY VISIT WITH YOU AFTER SURGERY IN YOUR PRIVATE ROOM DURING VISITING HOURS ONLY!  YOU NEED TO HAVE A COVID 19 TEST ON____9-11-2020___ @__10 :00AM_____, THIS TEST MUST BE DONE BEFORE SURGERY, COME  Tamara Malone , [id number].  ([place]) ONCE YOUR COVID TEST IS COMPLETED, PLEASE BEGIN THE QUARANTINE INSTRUCTIONS AS OUTLINED IN YOUR HANDOUT.                Tamara Malone   Your procedure is scheduled on: 02-01-2019   Report to [facility] Main  Entrance   Report to [facility] at 5:30AM     Call this number if you have problems the morning of surgery [place], NO CHEWING GUM CANDY OR MINTS.    Do not eat food After Midnight. YOU MAY HAVE CLEAR LIQUIDS FROM MIDNIGHT UNTIL 4:30AM. At 4:30AM Please finish the prescribed Pre-Surgery ENSURE drink. Nothing by mouth after you finish the ENSURE drink !   CLEAR LIQUID DIET   Foods Allowed                                                                     Foods Excluded  Coffee and tea, regular and decaf                             liquids that you cannot  Plain Jell-O any favor except red or purple                                           see through such as: Fruit ices (not with fruit pulp)                                     milk, soups, orange juice  Iced Popsicles                                    All solid food Carbonated beverages, regular and diet                                    Cranberry, grape and apple juices Sports drinks like Gatorade Lightly seasoned clear broth or consume(fat free) Sugar, honey syrup  Sample Menu Breakfast                                Lunch  Supper Cranberry juice                    Beef broth                             Chicken broth Jell-O                                     Grape juice                           Apple juice Coffee or tea                        Jell-O                                      Popsicle                                                Coffee or tea                        Coffee or tea  _____________________________________________________________________    Take these medicines the morning of surgery with A SIP OF WATER: TOPIRAMATE                                  You may not have any metal on your body including hair pins and              piercings  Do not wear jewelry, make-up, lotions, powders or perfumes, deodorant             Do not wear nail polish.  Do not shave  48 hours prior to surgery.     Do not bring valuables to the hospital. [place].  Contacts, dentures or bridgework may not be worn into surgery.                Please read over the following fact sheets you were given: _____________________________________________________________________             [facility] - Preparing for Surgery Before surgery, you can play an important role.  Because skin is not sterile, your skin needs to be as free of germs as possible.  You can reduce the number of germs on your skin by washing with CHG (chlorahexidine gluconate) soap before surgery.  CHG is an antiseptic cleaner which kills germs and bonds with the skin to continue killing germs even after washing. Please DO NOT use if you have an allergy to CHG or antibacterial soaps.  If your skin becomes reddened/irritated stop using the CHG and inform your nurse when you arrive at Short Stay. Do not shave (including legs and underarms) for at least 48 hours prior to the first CHG shower.  You may shave your face/neck. Please follow these instructions carefully:  1.  Shower with CHG  Soap the night before surgery and the  morning of Surgery.  2.  If you choose to wash  your hair, wash your hair first as usual with your  normal  shampoo.  3.  After you shampoo, rinse your hair and body thoroughly to remove the  shampoo.                           4.  Use CHG as you would any other liquid soap.  You can apply chg directly  to the skin and wash                       Gently with a scrungie or clean washcloth.  5.  Apply the CHG Soap to your body ONLY FROM THE NECK DOWN.   Do not use on face/ open                           Wound or open sores. Avoid contact with eyes, ears mouth and genitals (private parts).                       Wash face,  Genitals (private parts) with your normal soap.             6.  Wash thoroughly, paying special attention to the area where your surgery  will be performed.  7.  Thoroughly rinse your body with warm water from the neck down.  8.  DO NOT shower/wash with your normal soap after using and rinsing off  the CHG Soap.                9.  Pat yourself dry with a clean towel.            10.  Wear clean pajamas.            11.  Place clean sheets on your bed the night of your first shower and do not  sleep with pets. Day of Surgery : Do not apply any lotions/deodorants the morning of surgery.  Please wear clean clothes to the hospital/surgery center.  FAILURE TO FOLLOW THESE INSTRUCTIONS MAY RESULT IN THE CANCELLATION OF YOUR SURGERY PATIENT SIGNATURE_________________________________  NURSE SIGNATURE__________________________________  ________________________________________________________________________   Tamara Malone  An incentive spirometer is a tool that can help keep your lungs clear and active. This tool measures how well you are filling your lungs with each breath. Taking long deep breaths may help reverse or decrease the chance of developing breathing (pulmonary) problems (especially infection) following:  A long period of time when you are unable to move or be active. BEFORE THE PROCEDURE   If the spirometer  includes an indicator to show your best effort, your nurse or respiratory therapist will set it to a desired goal.  If possible, sit up straight or lean slightly forward. Try not to slouch.  Hold the incentive spirometer in an upright position. INSTRUCTIONS FOR USE  1. Sit on the edge of your bed if possible, or sit up as far as you can in bed or on a chair. 2. Hold the incentive spirometer in an upright position. 3. Breathe out normally. 4. Place the mouthpiece in your mouth and seal your lips tightly around it. 5. Breathe in slowly and as deeply as possible, raising the piston or the ball toward the top of the column. 6. Hold your breath  for 3-5 seconds or for as long as possible. Allow the piston or ball to fall to the bottom of the column. 7. Remove the mouthpiece from your mouth and breathe out normally. 8. Rest for a few seconds and repeat Steps 1 through 7 at least 10 times every 1-2 hours when you are awake. Take your time and take a few normal breaths between deep breaths. 9. The spirometer may include an indicator to show your best effort. Use the indicator as a goal to work toward during each repetition. 10. After each set of 10 deep breaths, practice coughing to be sure your lungs are clear. If you have an incision (the cut made at the time of surgery), support your incision when coughing by placing a pillow or rolled up towels firmly against it. Once you are able to get out of bed, walk around indoors and cough well. You may stop using the incentive spirometer when instructed by your caregiver.  RISKS AND COMPLICATIONS  Take your time so you do not get dizzy or light-headed.  If you are in pain, you may need to take or ask for pain medication before doing incentive spirometry. It is harder to take a deep breath if you are having pain. AFTER USE  Rest and breathe slowly and easily.  It can be helpful to keep track of a log of your progress. Your caregiver can provide you with a  simple table to help with this. If you are using the spirometer at home, follow these instructions: [place] IF:   You are having difficultly using the spirometer.  You have trouble using the spirometer as often as instructed.  Your pain medication is not giving enough relief while using the spirometer.  You develop fever of 100.5 F (38.1 C) or higher. SEEK IMMEDIATE MEDICAL CARE IF:   You cough up bloody sputum that had not been present before.  You develop fever of 102 F (38.9 C) or greater.  You develop worsening pain at or near the incision site. MAKE SURE YOU:   Understand these instructions.  Will watch your condition.  Will get help right away if you are not doing well or get worse. Document Released: 09/15/2006 Document Revised: 07/28/2011 Document Reviewed: 11/16/2006 [facility] Patient Information 2014 [place], [place].   ________________________________________________________________________

## 2019-01-25 ENCOUNTER — Other Ambulatory Visit: Payer: Self-pay

## 2019-01-25 ENCOUNTER — Encounter (HOSPITAL_COMMUNITY)
Admission: RE | Admit: 2019-01-25 | Discharge: 2019-01-25 | Disposition: A | Discharge status: Home / Self Care | Payer: 59 | Source: Ambulatory Visit | Attending: Orthopedic Surgery | Admitting: Orthopedic Surgery

## 2019-01-25 ENCOUNTER — Encounter (HOSPITAL_COMMUNITY): Payer: Self-pay

## 2019-01-25 DIAGNOSIS — Z01812 Encounter for preprocedural laboratory examination: Secondary | ICD-10-CM | POA: Diagnosis not present

## 2019-01-25 DIAGNOSIS — Z20828 Contact with and (suspected) exposure to other viral communicable diseases: Secondary | ICD-10-CM | POA: Diagnosis not present

## 2019-01-25 DIAGNOSIS — M1711 Unilateral primary osteoarthritis, right knee: Secondary | ICD-10-CM | POA: Diagnosis not present

## 2019-01-25 HISTORY — DX: Migraine, unspecified, not intractable, without status migrainosus: G43.909

## 2019-01-25 HISTORY — DX: Unspecified osteoarthritis, unspecified site: M19.90

## 2019-01-25 LAB — BASIC METABOLIC PANEL
Anion gap: 5 (ref 5–15)
BUN: 11 mg/dL (ref 6–20)
CO2: 29 mmol/L (ref 22–32)
Calcium: 9.3 mg/dL (ref 8.9–10.3)
Chloride: 108 mmol/L (ref 98–111)
Creatinine, Ser: 0.68 mg/dL (ref 0.44–1.00)
GFR calc Af Amer: >60 mL/min (ref >60)
GFR calc non Af Amer: >60 mL/min (ref >60)
Glucose, Bld: 89 mg/dL (ref 70–99)
Potassium: 4.6 mmol/L (ref 3.5–5.1)
Sodium: 142 mmol/L (ref 135–145)

## 2019-01-25 LAB — CBC
HCT: 39 % (ref 36.0–46.0)
Hemoglobin: 12.1 g/dL (ref 12.0–15.0)
MCH: 26.8 pg (ref 26.0–34.0)
MCHC: 31 g/dL (ref 30.0–36.0)
MCV: 86.5 fL (ref 80.0–100.0)
Platelets: 397 10*3/uL (ref 150–400)
RBC: 4.51 MIL/uL (ref 3.87–5.11)
RDW: 16.2 % — ABNORMAL HIGH (ref 11.5–15.5)
WBC: 4.1 10*3/uL (ref 4.0–10.5)
nRBC: 0 % (ref 0.0–0.2)

## 2019-01-25 LAB — SURGICAL PCR SCREEN
MRSA, PCR: NEGATIVE
Staphylococcus aureus: NEGATIVE

## 2019-01-25 NOTE — Progress Notes (Signed)
PCP - Tisovec, Fransico Him, MD Cardiologist -   Chest x-ray -  EKG - on chart 12-20-2018 from [facility] medical asso.  Stress Test -  ECHO -  Cardiac Cath -   Sleep Study -  CPAP -   Fasting Blood Sugar -  Checks Blood Sugar _____ times a day  Blood Thinner Instructions: Aspirin Instructions: Last Dose:  Anesthesia review:   Patient denies shortness of breath, fever, cough and chest pain at PAT appointment   Patient verbalized understanding of instructions that were given to them at the PAT appointment. Patient was also instructed that they will need to review over the PAT instructions again at home before surgery.

## 2019-01-25 NOTE — Progress Notes (Addendum)
Patient has deferred signing consent form until the day of surgery as she states she is supposed to be having her left knee done on 9-15 while Dr Luanna Cole consent order lists the right knee as the surgery site. RN called and lvmm for [facility] surgery scheduler Mamie Levers  [phone] to make aware of the above and to request Mardelle Matte address this .

## 2019-01-28 ENCOUNTER — Other Ambulatory Visit: Payer: Self-pay

## 2019-01-28 ENCOUNTER — Other Ambulatory Visit (HOSPITAL_COMMUNITY)
Admission: RE | Admit: 2019-01-28 | Discharge: 2019-01-28 | Disposition: A | Discharge status: Home / Self Care | Payer: 59 | Source: Ambulatory Visit | Attending: Orthopedic Surgery | Admitting: Orthopedic Surgery

## 2019-01-28 DIAGNOSIS — Z01812 Encounter for preprocedural laboratory examination: Secondary | ICD-10-CM | POA: Diagnosis not present

## 2019-01-30 LAB — NOVEL CORONAVIRUS, NAA (HOSP ORDER, SEND-OUT TO REF LAB; TAT 18-24 HRS): SARS-CoV-2, NAA: NOT DETECTED

## 2019-01-31 NOTE — Anesthesia Preprocedure Evaluation (Addendum)
Anesthesia Evaluation  Patient identified by MRN, date of birth, ID band Patient awake    Reviewed: Allergy & Precautions, H&P , NPO status , Patient's Chart, lab work & pertinent test results  Airway Mallampati: II  TM Distance: >3 FB Neck ROM: Full    Dental no notable dental hx. (+) Teeth Intact, Dental Advisory Given   Pulmonary neg pulmonary ROS,    Pulmonary exam normal breath sounds clear to auscultation       Cardiovascular Exercise Tolerance: Good hypertension, Pt. on medications  Rhythm:Regular Rate:Normal     Neuro/Psych  Headaches, negative psych ROS   GI/Hepatic negative GI ROS, Neg liver ROS,   Endo/Other  Morbid obesity  Renal/GU negative Renal ROS  negative genitourinary   Musculoskeletal  (+) Arthritis , Osteoarthritis,    Abdominal   Peds  Hematology negative hematology ROS (+)   Anesthesia Other Findings   Reproductive/Obstetrics negative OB ROS                            Anesthesia Physical Anesthesia Plan  ASA: III  Anesthesia Plan: Spinal and MAC   Post-op Pain Management:  Regional for Post-op pain   Induction: Intravenous  PONV Risk Score and Plan: 3 and Ondansetron, Dexamethasone, Propofol infusion and Midazolam  Airway Management Planned: Simple Face Mask  Additional Equipment:   Intra-op Plan:   Post-operative Plan:   Informed Consent: I have reviewed the patients History and Physical, chart, labs and discussed the procedure including the risks, benefits and alternatives for the proposed anesthesia with the patient or authorized representative who has indicated his/her understanding and acceptance.     Dental advisory given  Plan Discussed with: CRNA  Anesthesia Plan Comments:         Anesthesia Quick Evaluation

## 2019-01-31 NOTE — Progress Notes (Signed)
Pt aware of surgical time change for surgery scheduled 02/01/2019. Pt aware to arrive at [facility] admitting by 0730. To consume ERAS drink by 0700 otherwise no food after midnight and no liquid after 0700. Pt verbalized understanding. Also pt is aware to take medications as instructed per PST.

## 2019-02-01 ENCOUNTER — Encounter (HOSPITAL_COMMUNITY)
Admission: RE | Disposition: A | Discharge status: Home / Self Care | Payer: Self-pay | Source: Home / Self Care | Attending: Orthopedic Surgery

## 2019-02-01 ENCOUNTER — Observation Stay (HOSPITAL_COMMUNITY): Payer: 59

## 2019-02-01 ENCOUNTER — Ambulatory Visit (HOSPITAL_COMMUNITY): Payer: 59 | Admitting: Certified Registered Nurse Anesthetist

## 2019-02-01 ENCOUNTER — Ambulatory Visit (HOSPITAL_COMMUNITY): Payer: 59 | Admitting: Physician Assistant

## 2019-02-01 ENCOUNTER — Observation Stay (HOSPITAL_COMMUNITY)
Admission: RE | Admit: 2019-02-01 | Discharge: 2019-02-02 | Disposition: A | Discharge status: Home / Self Care | Payer: 59 | Attending: Orthopedic Surgery | Admitting: Orthopedic Surgery

## 2019-02-01 ENCOUNTER — Encounter (HOSPITAL_COMMUNITY): Payer: Self-pay

## 2019-02-01 ENCOUNTER — Other Ambulatory Visit: Payer: Self-pay

## 2019-02-01 DIAGNOSIS — G43909 Migraine, unspecified, not intractable, without status migrainosus: Secondary | ICD-10-CM | POA: Insufficient documentation

## 2019-02-01 DIAGNOSIS — Z1211 Encounter for screening for malignant neoplasm of colon: Secondary | ICD-10-CM

## 2019-02-01 DIAGNOSIS — M1712 Unilateral primary osteoarthritis, left knee: Principal | ICD-10-CM

## 2019-02-01 DIAGNOSIS — Z96652 Presence of left artificial knee joint: Secondary | ICD-10-CM

## 2019-02-01 DIAGNOSIS — Z6839 Body mass index (BMI) 39.0-39.9, adult: Secondary | ICD-10-CM | POA: Diagnosis not present

## 2019-02-01 DIAGNOSIS — M25562 Pain in left knee: Secondary | ICD-10-CM | POA: Diagnosis present

## 2019-02-01 DIAGNOSIS — Z79899 Other long term (current) drug therapy: Secondary | ICD-10-CM | POA: Diagnosis not present

## 2019-02-01 DIAGNOSIS — I10 Essential (primary) hypertension: Secondary | ICD-10-CM | POA: Diagnosis not present

## 2019-02-01 DIAGNOSIS — M25762 Osteophyte, left knee: Secondary | ICD-10-CM | POA: Insufficient documentation

## 2019-02-01 HISTORY — PX: PARTIAL KNEE ARTHROPLASTY: SHX2174

## 2019-02-01 HISTORY — DX: Unilateral primary osteoarthritis, left knee: M17.12

## 2019-02-01 SURGERY — ARTHROPLASTY, KNEE, UNICOMPARTMENTAL
Anesthesia: Monitor Anesthesia Care | Site: Knee | Laterality: Left

## 2019-02-01 MED ORDER — BUPIVACAINE IN DEXTROSE 0.75-8.25 % IT SOLN
INTRATHECAL | Status: DC | PRN
  Administered 2019-02-01: 1.6 mL via INTRATHECAL

## 2019-02-01 MED ORDER — BUPIVACAINE-EPINEPHRINE (PF) 0.5% -1:200000 IJ SOLN
INTRAMUSCULAR | Status: DC | PRN
  Administered 2019-02-01: 20 mL via PERINEURAL

## 2019-02-01 MED ORDER — PHENYLEPHRINE 40 MCG/ML (10ML) SYRINGE FOR IV PUSH (FOR BLOOD PRESSURE SUPPORT)
PREFILLED_SYRINGE | INTRAVENOUS | Status: AC
  Filled 2019-02-01: qty 10

## 2019-02-01 MED ORDER — PROPOFOL 10 MG/ML IV BOLUS
INTRAVENOUS | Status: AC
  Filled 2019-02-01: qty 20

## 2019-02-01 MED ORDER — METOCLOPRAMIDE HCL 5 MG/ML IJ SOLN: 5.0000 mg | Freq: Three times a day (TID) | INTRAMUSCULAR | Status: DC | PRN

## 2019-02-01 MED ORDER — TOPIRAMATE ER 25 MG PO SPRINKLE CAP24
150.0000 mg | EXTENDED_RELEASE_CAPSULE | Freq: Every day | ORAL | Status: DC
  Administered 2019-02-02: 150 mg via ORAL
  Filled 2019-02-01: qty 2

## 2019-02-01 MED ORDER — ONDANSETRON HCL 4 MG PO TABS: 4.0000 mg | ORAL_TABLET | Freq: Three times a day (TID) | ORAL | 0 refills | Status: DC | PRN

## 2019-02-01 MED ORDER — HYDROCODONE-ACETAMINOPHEN 5-325 MG PO TABS
1.0000 | ORAL_TABLET | ORAL | Status: DC | PRN
  Administered 2019-02-01 – 2019-02-02 (×2): 1 via ORAL
  Administered 2019-02-02 (×2): 2 via ORAL
  Filled 2019-02-01 (×2): qty 2
  Filled 2019-02-01 (×2): qty 1

## 2019-02-01 MED ORDER — ACETAMINOPHEN 500 MG PO TABS
1000.0000 mg | ORAL_TABLET | Freq: Once | ORAL | Status: AC
  Administered 2019-02-01: 1000 mg via ORAL
  Filled 2019-02-01: qty 2

## 2019-02-01 MED ORDER — MIDAZOLAM HCL 2 MG/2ML IJ SOLN
1.0000 mg | INTRAMUSCULAR | Status: DC
  Administered 2019-02-01: 2 mg via INTRAVENOUS
  Filled 2019-02-01: qty 2

## 2019-02-01 MED ORDER — ONDANSETRON HCL 4 MG PO TABS: 4.0000 mg | ORAL_TABLET | Freq: Four times a day (QID) | ORAL | Status: DC | PRN

## 2019-02-01 MED ORDER — BISACODYL 10 MG RE SUPP: 10.0000 mg | Freq: Every day | RECTAL | Status: DC | PRN

## 2019-02-01 MED ORDER — ACETAMINOPHEN 325 MG PO TABS: 325.0000 mg | ORAL_TABLET | Freq: Four times a day (QID) | ORAL | Status: DC | PRN

## 2019-02-01 MED ORDER — DEXAMETHASONE SODIUM PHOSPHATE 10 MG/ML IJ SOLN
INTRAMUSCULAR | Status: AC
  Filled 2019-02-01: qty 1

## 2019-02-01 MED ORDER — POLYETHYLENE GLYCOL 3350 17 G PO PACK: 17.0000 g | PACK | Freq: Every day | ORAL | Status: DC | PRN

## 2019-02-01 MED ORDER — POVIDONE-IODINE 10 % EX SWAB: 2.0000 "application " | Freq: Once | CUTANEOUS | Status: DC

## 2019-02-01 MED ORDER — ASPIRIN EC 325 MG PO TBEC: 325.0000 mg | DELAYED_RELEASE_TABLET | Freq: Two times a day (BID) | ORAL | 0 refills | Status: DC

## 2019-02-01 MED ORDER — SODIUM CHLORIDE 0.9 % IR SOLN
Status: DC | PRN
  Administered 2019-02-01: 1000 mL

## 2019-02-01 MED ORDER — CEFAZOLIN SODIUM-DEXTROSE 2-4 GM/100ML-% IV SOLN
2.0000 g | Freq: Four times a day (QID) | INTRAVENOUS | Status: AC
  Administered 2019-02-01 – 2019-02-02 (×2): 2 g via INTRAVENOUS
  Filled 2019-02-01 (×2): qty 100

## 2019-02-01 MED ORDER — STERILE WATER FOR IRRIGATION IR SOLN
Status: DC | PRN
  Administered 2019-02-01 (×2): 1000 mL

## 2019-02-01 MED ORDER — DOCUSATE SODIUM 100 MG PO CAPS
100.0000 mg | ORAL_CAPSULE | Freq: Two times a day (BID) | ORAL | Status: DC
  Administered 2019-02-01 – 2019-02-02 (×2): 100 mg via ORAL
  Filled 2019-02-01 (×2): qty 1

## 2019-02-01 MED ORDER — ONDANSETRON HCL 4 MG/2ML IJ SOLN: 4.0000 mg | Freq: Four times a day (QID) | INTRAMUSCULAR | Status: DC | PRN

## 2019-02-01 MED ORDER — DEXAMETHASONE SODIUM PHOSPHATE 10 MG/ML IJ SOLN
INTRAMUSCULAR | Status: DC | PRN
  Administered 2019-02-01: 8 mg via INTRAVENOUS

## 2019-02-01 MED ORDER — TOPIRAMATE ER 150 MG PO SPRINKLE CAP24: 150.0000 mg | EXTENDED_RELEASE_CAPSULE | Freq: Every day | ORAL | Status: DC

## 2019-02-01 MED ORDER — TRANEXAMIC ACID-NACL 1000-0.7 MG/100ML-% IV SOLN
1000.0000 mg | Freq: Once | INTRAVENOUS | Status: AC
  Administered 2019-02-01: 1000 mg via INTRAVENOUS
  Filled 2019-02-01: qty 100

## 2019-02-01 MED ORDER — ASPIRIN EC 325 MG PO TBEC
325.0000 mg | DELAYED_RELEASE_TABLET | Freq: Two times a day (BID) | ORAL | Status: DC
  Administered 2019-02-01 – 2019-02-02 (×2): 325 mg via ORAL
  Filled 2019-02-01 (×2): qty 1

## 2019-02-01 MED ORDER — MORPHINE SULFATE (PF) 2 MG/ML IV SOLN
0.5000 mg | INTRAVENOUS | Status: DC | PRN
  Administered 2019-02-01: 1 mg via INTRAVENOUS
  Filled 2019-02-01: qty 1

## 2019-02-01 MED ORDER — DIPHENHYDRAMINE HCL 12.5 MG/5ML PO ELIX: 12.5000 mg | ORAL_SOLUTION | ORAL | Status: DC | PRN

## 2019-02-01 MED ORDER — SENNA-DOCUSATE SODIUM 8.6-50 MG PO TABS: 2.0000 | ORAL_TABLET | Freq: Every day | ORAL | 1 refills | Status: DC

## 2019-02-01 MED ORDER — ALUM & MAG HYDROXIDE-SIMETH 200-200-20 MG/5ML PO SUSP: 30.0000 mL | ORAL | Status: DC | PRN

## 2019-02-01 MED ORDER — BUPIVACAINE-EPINEPHRINE (PF) 0.5% -1:200000 IJ SOLN
INTRAMUSCULAR | Status: AC
  Filled 2019-02-01: qty 30

## 2019-02-01 MED ORDER — ONDANSETRON HCL 4 MG/2ML IJ SOLN
INTRAMUSCULAR | Status: DC | PRN
  Administered 2019-02-01: 4 mg via INTRAVENOUS

## 2019-02-01 MED ORDER — PROPOFOL 500 MG/50ML IV EMUL
INTRAVENOUS | Status: DC | PRN
  Administered 2019-02-01: 100 ug/kg/min via INTRAVENOUS

## 2019-02-01 MED ORDER — METHOCARBAMOL 500 MG IVPB - SIMPLE MED
500.0000 mg | Freq: Four times a day (QID) | INTRAVENOUS | Status: DC | PRN
  Filled 2019-02-01: qty 50

## 2019-02-01 MED ORDER — ACETAMINOPHEN 500 MG PO TABS: 1000.0000 mg | ORAL_TABLET | Freq: Once | ORAL | Status: DC

## 2019-02-01 MED ORDER — FENTANYL CITRATE (PF) 100 MCG/2ML IJ SOLN
50.0000 ug | INTRAMUSCULAR | Status: DC
  Administered 2019-02-01: 100 ug via INTRAVENOUS
  Filled 2019-02-01: qty 2

## 2019-02-01 MED ORDER — PHENOL 1.4 % MT LIQD: 1.0000 | OROMUCOSAL | Status: DC | PRN

## 2019-02-01 MED ORDER — LACTATED RINGERS IV SOLN
INTRAVENOUS | Status: DC
  Administered 2019-02-01 (×2): via INTRAVENOUS

## 2019-02-01 MED ORDER — HYDROCHLOROTHIAZIDE 25 MG PO TABS
12.5000 mg | ORAL_TABLET | Freq: Two times a day (BID) | ORAL | Status: DC
  Filled 2019-02-01: qty 1

## 2019-02-01 MED ORDER — CEFAZOLIN SODIUM-DEXTROSE 2-4 GM/100ML-% IV SOLN
2.0000 g | INTRAVENOUS | Status: AC
  Administered 2019-02-01: 2 g via INTRAVENOUS
  Filled 2019-02-01: qty 100

## 2019-02-01 MED ORDER — MAGNESIUM CITRATE PO SOLN: 1.0000 | Freq: Once | ORAL | Status: DC | PRN

## 2019-02-01 MED ORDER — METOCLOPRAMIDE HCL 5 MG PO TABS: 5.0000 mg | ORAL_TABLET | Freq: Three times a day (TID) | ORAL | Status: DC | PRN

## 2019-02-01 MED ORDER — MENTHOL 3 MG MT LOZG: 1.0000 | LOZENGE | OROMUCOSAL | Status: DC | PRN

## 2019-02-01 MED ORDER — POTASSIUM CHLORIDE IN NACL 20-0.45 MEQ/L-% IV SOLN
INTRAVENOUS | Status: DC
  Administered 2019-02-01: 19:00:00 via INTRAVENOUS
  Filled 2019-02-01 (×2): qty 1000

## 2019-02-01 MED ORDER — PROPOFOL 10 MG/ML IV BOLUS
INTRAVENOUS | Status: AC
  Filled 2019-02-01: qty 60

## 2019-02-01 MED ORDER — TRANEXAMIC ACID-NACL 1000-0.7 MG/100ML-% IV SOLN
1000.0000 mg | INTRAVENOUS | Status: AC
  Administered 2019-02-01: 1000 mg via INTRAVENOUS
  Filled 2019-02-01: qty 100

## 2019-02-01 MED ORDER — KETOROLAC TROMETHAMINE 30 MG/ML IJ SOLN
INTRAMUSCULAR | Status: AC
  Filled 2019-02-01: qty 1

## 2019-02-01 MED ORDER — ZOLPIDEM TARTRATE 5 MG PO TABS: 5.0000 mg | ORAL_TABLET | Freq: Every evening | ORAL | Status: DC | PRN

## 2019-02-01 MED ORDER — METHOCARBAMOL 500 MG PO TABS
500.0000 mg | ORAL_TABLET | Freq: Four times a day (QID) | ORAL | Status: DC | PRN
  Administered 2019-02-01 – 2019-02-02 (×2): 500 mg via ORAL
  Filled 2019-02-01 (×2): qty 1

## 2019-02-01 MED ORDER — DEXAMETHASONE SODIUM PHOSPHATE 10 MG/ML IJ SOLN
10.0000 mg | Freq: Once | INTRAMUSCULAR | Status: AC
  Administered 2019-02-02: 10 mg via INTRAVENOUS
  Filled 2019-02-01: qty 1

## 2019-02-01 MED ORDER — KETOROLAC TROMETHAMINE 30 MG/ML IJ SOLN
INTRAMUSCULAR | Status: DC | PRN
  Administered 2019-02-01: 30 mg

## 2019-02-01 MED ORDER — ONDANSETRON HCL 4 MG/2ML IJ SOLN
INTRAMUSCULAR | Status: AC
  Filled 2019-02-01: qty 2

## 2019-02-01 MED ORDER — KETOROLAC TROMETHAMINE 15 MG/ML IJ SOLN
7.5000 mg | Freq: Four times a day (QID) | INTRAMUSCULAR | Status: DC
  Administered 2019-02-01 – 2019-02-02 (×3): 7.5 mg via INTRAVENOUS
  Filled 2019-02-01 (×3): qty 1

## 2019-02-01 MED ORDER — CHLORHEXIDINE GLUCONATE 4 % EX LIQD: 60.0000 mL | Freq: Once | CUTANEOUS | Status: DC

## 2019-02-01 MED ORDER — PROPOFOL 10 MG/ML IV BOLUS
INTRAVENOUS | Status: AC
  Filled 2019-02-01: qty 40

## 2019-02-01 MED ORDER — PROPOFOL 10 MG/ML IV BOLUS
INTRAVENOUS | Status: DC | PRN
  Administered 2019-02-01 (×2): 20 mg via INTRAVENOUS

## 2019-02-01 MED ORDER — BACLOFEN 10 MG PO TABS: 10.0000 mg | ORAL_TABLET | Freq: Three times a day (TID) | ORAL | 0 refills | Status: DC

## 2019-02-01 MED ORDER — HYDROCODONE-ACETAMINOPHEN 7.5-325 MG PO TABS: 1.0000 | ORAL_TABLET | ORAL | Status: DC | PRN

## 2019-02-01 MED ORDER — ACETAMINOPHEN 500 MG PO TABS
500.0000 mg | ORAL_TABLET | Freq: Four times a day (QID) | ORAL | Status: DC
  Administered 2019-02-01 – 2019-02-02 (×3): 500 mg via ORAL
  Filled 2019-02-01 (×3): qty 1

## 2019-02-01 MED ORDER — PHENYLEPHRINE 40 MCG/ML (10ML) SYRINGE FOR IV PUSH (FOR BLOOD PRESSURE SUPPORT)
PREFILLED_SYRINGE | INTRAVENOUS | Status: DC | PRN
  Administered 2019-02-01: 80 ug via INTRAVENOUS
  Administered 2019-02-01: 40 ug via INTRAVENOUS
  Administered 2019-02-01 (×2): 80 ug via INTRAVENOUS

## 2019-02-01 MED ORDER — HYDROMORPHONE HCL 1 MG/ML IJ SOLN: 0.2500 mg | INTRAMUSCULAR | Status: DC | PRN

## 2019-02-01 MED ORDER — HYDROCODONE-ACETAMINOPHEN 10-325 MG PO TABS: 1.0000 | ORAL_TABLET | Freq: Four times a day (QID) | ORAL | 0 refills | Status: DC | PRN

## 2019-02-01 SURGICAL SUPPLY — 69 items
BAG SPEC THK2 15X12 ZIP CLS (MISCELLANEOUS) ×1
BAG ZIPLOCK 12X15 (MISCELLANEOUS) ×3 IMPLANT
BANDAGE ESMARK 6X9 LF (GAUZE/BANDAGES/DRESSINGS) ×1 IMPLANT
BEARING TIBIAL OXFORD MED 4 (Orthopedic Implant) ×1 IMPLANT
BEARING TIBIAL OXFORD MED 4MM (Orthopedic Implant) ×1 IMPLANT
BLADE SURG 15 STRL LF DISP TIS (BLADE) ×1 IMPLANT
BLADE SURG 15 STRL SS (BLADE) ×3
BNDG CMPR 9X6 STRL LF SNTH (GAUZE/BANDAGES/DRESSINGS) ×1
BNDG CMPR MED 15X6 ELC VLCR LF (GAUZE/BANDAGES/DRESSINGS) ×1
BNDG ELASTIC 6X15 VLCR STRL LF (GAUZE/BANDAGES/DRESSINGS) ×3 IMPLANT
BNDG ESMARK 6X9 LF (GAUZE/BANDAGES/DRESSINGS) ×3
BOWL SMART MIX CTS (DISPOSABLE) ×3 IMPLANT
BRNG TIB MED 4 PHS 3 LT MEN (Orthopedic Implant) ×1 IMPLANT
CEMENT BONE R 1X40 (Cement) ×3 IMPLANT
CLOSURE STERI-STRIP 1/2X4 (GAUZE/BANDAGES/DRESSINGS) ×1
CLSR STERI-STRIP ANTIMIC 1/2X4 (GAUZE/BANDAGES/DRESSINGS) ×2 IMPLANT
COMPONENT TIB MDL OXFRD LT SZA (Joint) IMPLANT
COVER SURGICAL LIGHT HANDLE (MISCELLANEOUS) ×3 IMPLANT
COVER WAND RF STERILE (DRAPES) IMPLANT
CUFF TOURN SGL QUICK 34 (TOURNIQUET CUFF) ×3
CUFF TRNQT CYL 34X4.125X (TOURNIQUET CUFF) ×1 IMPLANT
DECANTER SPIKE VIAL GLASS SM (MISCELLANEOUS) IMPLANT
DRAPE POUCH INSTRU U-SHP 10X18 (DRAPES) ×3 IMPLANT
DRAPE SHEET LG 3/4 BI-LAMINATE (DRAPES) ×3 IMPLANT
DRAPE U-SHAPE 47X51 STRL (DRAPES) ×3 IMPLANT
DRSG MEPILEX BORDER 4X8 (GAUZE/BANDAGES/DRESSINGS) ×3 IMPLANT
DRSG PAD ABDOMINAL 8X10 ST (GAUZE/BANDAGES/DRESSINGS) ×3 IMPLANT
DURAPREP 26ML APPLICATOR (WOUND CARE) ×6 IMPLANT
ELECT REM PT RETURN 15FT ADLT (MISCELLANEOUS) ×3 IMPLANT
FACESHIELD WRAPAROUND (MASK) ×3 IMPLANT
FACESHIELD WRAPAROUND OR TEAM (MASK) ×1 IMPLANT
GLOVE BIO SURGEON STRL SZ7.5 (GLOVE) ×3 IMPLANT
GLOVE BIOGEL PI IND STRL 8 (GLOVE) ×2 IMPLANT
GLOVE BIOGEL PI INDICATOR 8 (GLOVE) ×4
GLOVE SURG ORTHO 8.0 STRL STRW (GLOVE) ×3 IMPLANT
GOWN STRL REUS W/TWL 2XL LVL3 (GOWN DISPOSABLE) ×3 IMPLANT
GOWN STRL REUS W/TWL LRG LVL3 (GOWN DISPOSABLE) ×3 IMPLANT
HANDPIECE INTERPULSE COAX TIP (DISPOSABLE) ×3
HOLDER FOLEY CATH W/STRAP (MISCELLANEOUS) IMPLANT
HOOD PEEL AWAY FLYTE STAYCOOL (MISCELLANEOUS) ×6 IMPLANT
IMMOBILIZER KNEE 20 (SOFTGOODS) ×3
IMMOBILIZER KNEE 20 THIGH 36 (SOFTGOODS) IMPLANT
IMMOBILIZER KNEE 22 UNIV (SOFTGOODS) ×1 IMPLANT
KIT BASIN OR (CUSTOM PROCEDURE TRAY) ×3 IMPLANT
KIT TURNOVER KIT A (KITS) IMPLANT
NDL SAFETY ECLIPSE 18X1.5 (NEEDLE) ×1 IMPLANT
NEEDLE HYPO 18GX1.5 SHARP (NEEDLE) ×3
NS IRRIG 1000ML POUR BTL (IV SOLUTION) ×3 IMPLANT
PACK BLADE SAW RECIP 70 3 PT (BLADE) ×2 IMPLANT
PACK ICE MAXI GEL EZY WRAP (MISCELLANEOUS) ×3 IMPLANT
PACK TOTAL JOINT (CUSTOM PROCEDURE TRAY) ×3 IMPLANT
PEG TWIN FEM CEMENTED MED (Knees) ×2 IMPLANT
PROTECTOR NERVE ULNAR (MISCELLANEOUS) ×3 IMPLANT
SET HNDPC FAN SPRY TIP SCT (DISPOSABLE) ×1 IMPLANT
SUCTION FRAZIER HANDLE 12FR (TUBING) ×2
SUCTION TUBE FRAZIER 12FR DISP (TUBING) ×1 IMPLANT
SUT VIC AB 0 CT1 36 (SUTURE) ×3 IMPLANT
SUT VIC AB 2-0 CT1 27 (SUTURE) ×3
SUT VIC AB 2-0 CT1 TAPERPNT 27 (SUTURE) ×1 IMPLANT
SUT VIC AB 3-0 SH 8-18 (SUTURE) ×3 IMPLANT
SYR 20ML LL LF (SYRINGE) ×3 IMPLANT
SYR 30ML LL (SYRINGE) ×3 IMPLANT
SYR 3ML LL SCALE MARK (SYRINGE) ×3 IMPLANT
TIBIA MEDIAL OXFORD LEFT SZ A (Joint) ×3 IMPLANT
TOWEL OR 17X26 10 PK STRL BLUE (TOWEL DISPOSABLE) ×3 IMPLANT
TOWEL OR NON WOVEN STRL DISP B (DISPOSABLE) ×3 IMPLANT
TRAY FOLEY MTR SLVR 16FR STAT (SET/KITS/TRAYS/PACK) ×3 IMPLANT
WATER STERILE IRR 1000ML POUR (IV SOLUTION) ×3 IMPLANT
WRAP KNEE MAXI GEL POST OP (GAUZE/BANDAGES/DRESSINGS) ×2 IMPLANT

## 2019-02-01 NOTE — Anesthesia Procedure Notes (Addendum)
Spinal  Patient location during procedure: OR Start time: 02/01/2019 2:43 PM End time: 02/01/2019 2:52 PM Reason for block: at surgeon's request Staffing Resident/CRNA: Anne Fu, CRNA Performed: resident/CRNA  Preanesthetic Checklist Completed: patient identified, site marked, surgical consent, pre-op evaluation, timeout performed, IV checked, risks and benefits discussed and monitors and equipment checked Spinal Block Patient position: sitting Prep: DuraPrep Patient monitoring: heart rate, continuous pulse ox and blood pressure Approach: midline Location: L3-4 Injection technique: single-shot Needle Needle type: Pencan  Needle gauge: 24 G Needle length: 9 cm Assessment Sensory level: T6 Additional Notes  Functioning IV was confirmed and monitors were applied. Expiration date of kit checked and confirmed. Sterile prep and drape, including hand hygiene and sterile gloves were used. The patient was positioned and the spine was prepped. The skin was anesthetized with lidocaine.  Free flow of clear CSF was obtained prior to injecting local anesthetic into the CSF X 1 attempt.  The spinal needle aspirated freely following injection.  The needle was carefully withdrawn. Patient tolerated procedure well, without complications. Loss of motor and sensory on exam post injection.

## 2019-02-01 NOTE — Anesthesia Procedure Notes (Signed)
Anesthesia Regional Block: Adductor canal block   Pre-Anesthetic Checklist: ,, timeout performed, Correct Patient, Correct Site, Correct Laterality, Correct Procedure, Correct Position, site marked, Risks and benefits discussed, pre-op evaluation,  At surgeon's request and post-op pain management  Laterality: Left  Prep: Maximum Sterile Barrier Precautions used, chloraprep       Needles:  Injection technique: Single-shot  Needle Type: Echogenic Stimulator Needle     Needle Length: 9cm  Needle Gauge: 21     Additional Needles:   Procedures:,,,, ultrasound used (permanent image in chart),,,,  Narrative:  Start time: 02/01/2019 2:23 PM End time: 02/01/2019 2:33 PM Injection made incrementally with aspirations every 5 mL.  Performed by: Personally  Anesthesiologist: Roderic Palau, MD  Additional Notes: 2% Lidocaine skin wheel.

## 2019-02-01 NOTE — Transfer of Care (Signed)
Immediate Anesthesia Transfer of Care Note  Patient: Tamara Malone  Procedure(s) Performed: Procedure(s): UNICOMPARTMENTAL LEFT KNEE (Left)  Patient Location: PACU  Anesthesia Type:Spinal  Level of Consciousness:  sedated, patient cooperative and responds to stimulation  Airway & Oxygen Therapy:Patient Spontanous Breathing and Patient connected to face mask oxgen  Post-op Assessment:  Report given to PACU RN and Post -op Vital signs reviewed and stable  Post vital signs:  Reviewed and stable  Last Vitals:  Vitals:   02/01/19 1430 02/01/19 1435  BP: 122/84 132/73  Pulse: 61   Resp: 16 16  Temp:    SpO2: 123XX123 [id number]    Complications: No apparent anesthesia complications

## 2019-02-01 NOTE — Discharge Instructions (Signed)

## 2019-02-01 NOTE — Op Note (Signed)
02/01/2019  4:18 PM  PATIENT:  Tamara Malone    PRE-OPERATIVE DIAGNOSIS:  Left knee primary localized osteoarthritis  POST-OPERATIVE DIAGNOSIS:  Same  PROCEDURE: LEFT Unicompartmental Knee Arthroplasty  SURGEON:  Johnny Bridge, MD  PHYSICIAN ASSISTANT: Joya Gaskins, OPA-C, present and scrubbed throughout the case, critical for completion in a timely fashion, and for retraction, instrumentation, and closure.  ANESTHESIA:   spinal  ESTIMATED BLOOD LOSS: 100 ml  UNIQUE ASPECTS OF THE CASE: She had a fairly sizable tibial cysts posteriorly, she had almost a pigmented villonodular synovitis type appearance to the synovium with bubbly pink tissue.  She had extensive chondral loss on the medial side, some grade 2 changes on the patellofemoral joint on the trochlea, the undersurface of the patella had grade 1 changes, the lateral side was intact although there was an osteophyte forming on the lateral rim, and there was also some circumferential osteophytes on the patella which I removed.  She was slightly tighter in extension than in flexion, but this was not more than a millimeter, and I stuck with the 4.  PREOPERATIVE INDICATIONS:  [name] is a  53 y.o. female with a diagnosis of OA LEFT  KNEE who failed conservative measures and elected for surgical management.    The risks benefits and alternatives were discussed with the patient preoperatively including but not limited to the risks of infection, bleeding, nerve injury, cardiopulmonary complications, blood clots, the need for revision surgery, among others, and the patient was willing to proceed.  OPERATIVE IMPLANTS: Biomet Oxford mobile bearing medial compartment arthroplasty femur size medium, tibia size 4, bearing size A.  OPERATIVE FINDINGS: Endstage grade 4 medial compartment osteoarthritis.  The ACL had some mucoid degeneration but structurally was intact.  OPERATIVE PROCEDURE: The patient was brought to the operating room  placed in the supine position.  Spinal anesthesia was administered. IV antibiotics were given. The lower extremity was placed in the legholder and prepped and draped in usual sterile fashion.  Time out was performed.  The leg was elevated and exsanguinated and the tourniquet was inflated. Anteromedial incision was performed, and I took care to preserve the MCL. Parapatellar incision was carried out, and the osteophytes were excised, along with the medial meniscus and a small portion of the fat pad.  The extra medullary tibial cutting jig was applied, using the spoon and the 79mm G-Clamp and the 2 mm shim, and I took care to protect the anterior cruciate ligament insertion and the tibial spine. The medial collateral ligament was also protected, and I resected my proximal tibia, matching the anatomic slope.   The proximal tibial bony cut was removed in one piece, and I turned my attention to the femur.  The intramedullary femoral rod was placed using the drill, and then using the appropriate reference, I assembled the femoral jig, setting my posterior cutting block. I resected my posterior femur, used the 0 spigot for the anterior femur, and then measured my gap.   I then used the appropriate mill to match the extension gap to the flexion gap. The second milling was at a 2 and then again at a 3.  The gaps were then measured again with the appropriate feeler gauges. Once I had balanced flexion and extension gaps, I then completed the preparation of the femur.  I milled off the anterior aspect of the distal femur to prevent impingement. I also exposed the tibia, and selected the above-named component, and then used the cutting jig to  prepare the keel slot on the tibia. I also used the awl to curette out the bone to complete the preparation of the keel. The back wall was intact.  I then placed trial components, and it was found to have excellent motion, and appropriate balance.  I then cemented the  components into place, cementing the tibia first, removing all excess cement, and then cementing the femur.  All loose cement was removed.  The real polyethylene insert was applied manually, and the knee was taken through functional range of motion, and found to have excellent stability and restoration of joint motion, with excellent balance.  The wounds were irrigated copiously, and the parapatellar tissue closed with Vicryl, followed by Vicryl for the subcutaneous tissue, with routine closure with Steri-Strips and sterile gauze.  The tourniquet was released, and the patient was awakened and extubated and returned to PACU in stable and satisfactory condition. There were no complications.

## 2019-02-01 NOTE — H&P (Signed)
PREOPERATIVE H&P  Chief Complaint: Left knee pain  HPI: Tamara Malone is a 53 y.o. female who presents for preoperative history and physical with a diagnosis of left knee primary localized osteoarthritis. Symptoms are rated as moderate to severe, and have been worsening.  This is significantly impairing activities of daily living.  She has elected for surgical management.   She has failed injections, activity modification, anti-inflammatories, and assistive devices.  Preoperative X-rays demonstrate end stage degenerative changes with osteophyte formation, loss of joint space, subchondral sclerosis.   Past Medical History:  Diagnosis Date  . Arthritis   . Hypertension   . Migraines    once very couple months   . Obesity    Past Surgical History:  Procedure Laterality Date  . ABDOMINAL HYSTERECTOMY     Social History   Socioeconomic History  . Marital status: Married    Spouse name: Not on file  . Number of children: Not on file  . Years of education: Not on file  . Highest education level: Not on file  Occupational History  . Not on file  Social Needs  . Financial resource strain: Not on file  . Food insecurity    Worry: Not on file    Inability: Not on file  . Transportation needs    Medical: Not on file    Non-medical: Not on file  Tobacco Use  . Smoking status: Never Smoker  . Smokeless tobacco: Never Used  Substance and Sexual Activity  . Alcohol use: No  . Drug use: No  . Sexual activity: Never  Lifestyle  . Physical activity    Days per week: Not on file    Minutes per session: Not on file  . Stress: Not on file  Relationships  . Social Herbalist on phone: Not on file    Gets together: Not on file    Attends religious service: Not on file    Active member of club or organization: Not on file    Attends meetings of clubs or organizations: Not on file    Relationship status: Not on file  Other Topics Concern  . Not on file  Social  History Narrative  . Not on file   Family History  Problem Relation Age of Onset  . Hypertension Other   . Hyperlipidemia Other   . Diabetes Other   . Stroke Other   . Heart disease Other   . Diabetes Mother   . Diabetes Sister   . Diabetes Brother   . Breast cancer Maternal Aunt   . Heart disease Maternal Grandmother   . Colon cancer Neg Hx    No Known Allergies Prior to Admission medications   Medication Sig Start Date End Date Taking? Authorizing Provider  hydrochlorothiazide (HYDRODIURIL) 12.5 MG tablet Take 12.5 mg by mouth 2 (two) times daily.    Yes [provider]  topiramate ER (QUDEXY XR) 150 MG CS24 sprinkle capsule Take 150 mg by mouth daily.   Yes [provider]  phentermine (ADIPEX-P) 37.5 MG tablet Take 37.5 mg by mouth daily before breakfast.    [provider]     Positive ROS: All other systems have been reviewed and were otherwise negative with the exception of those mentioned in the HPI and as above.  Physical Exam: BP 130/81   Pulse 84   Temp 98.4 F (36.9 C) (Oral)   Resp 18   Ht 5\' 4"  (1.626 m)   Wt 104.3  kg   SpO2 100%   BMI 39.48 kg/m   General: Alert, no acute distress Cardiovascular: No pedal edema Respiratory: No cyanosis, no use of accessory musculature GI: No organomegaly, abdomen is soft and non-tender Skin: No lesions in the area of chief complaint Neurologic: Sensation intact distally Psychiatric: Patient is competent for consent with normal mood and affect Lymphatic: No axillary or cervical lymphadenopathy  MUSCULOSKELETAL: Left knee has varus alignment with crepitance and significant pain to palpation and pseudolaxity, 0 to 115 degrees of motion.  Assessment: Left knee primary localized osteoarthritis with coexisting morbid obesity   Plan: Plan for Procedure(s): UNICOMPARTMENTAL LEFT KNEE  The risks benefits and alternatives were discussed with the patient including but not limited to the risks of  nonoperative treatment, versus surgical intervention including infection, bleeding, nerve injury,  blood clots, cardiopulmonary complications, morbidity, mortality, among others, and they were willing to proceed.    Patient's anticipated LOS is less than 2 midnights, meeting these requirements: - Younger than 72 - Lives within 1 hour of care - Has a competent adult at home to recover with post-op recover - NO history of  - Chronic pain requiring opiods  - Diabetes  - Coronary Artery Disease  - Heart failure  - Heart attack  - Stroke  - DVT/VTE  - Cardiac arrhythmia  - Respiratory Failure/COPD  - Renal failure  - Anemia  - Advanced Liver disease   Our original authorization for surgery was for the right knee, which was erroneous, although she does have arthritis on the right knee.  We are awaiting the last minute authorization for the left knee surgery prior to proceeding.  Hopefully this will be achieved at some point today in a timely fashion to allow for surgical intervention, otherwise we will need to reschedule.     Johnny Bridge, MD Cell [phone]   02/01/2019 9:42 AM

## 2019-02-01 NOTE — Progress Notes (Signed)
Assisted Dr. Edmond Fitzgerald with left, ultrasound guided, adductor canal block. Side rails up, monitors on throughout procedure. See vital signs in flow sheet. Tolerated Procedure well. 

## 2019-02-01 NOTE — Anesthesia Postprocedure Evaluation (Signed)
Anesthesia Post Note  Patient: Tamara Malone  Procedure(s) Performed: UNICOMPARTMENTAL LEFT KNEE (Left Knee)     Patient location during evaluation: PACU Anesthesia Type: MAC, Spinal and Regional Level of consciousness: oriented and awake and alert Pain management: pain level controlled Vital Signs Assessment: post-procedure vital signs reviewed and stable Respiratory status: spontaneous breathing, respiratory function stable and patient connected to nasal cannula oxygen Cardiovascular status: blood pressure returned to baseline and stable Postop Assessment: no headache, no backache, no apparent nausea or vomiting, spinal receding and patient able to bend at knees Anesthetic complications: no    Last Vitals:  Vitals:   02/01/19 1715 02/01/19 1730  BP: 127/84 132/83  Pulse: 62 (!) 55  Resp: 12 11  Temp:  (!) 36.4 C  SpO2: 100% 100%    Last Pain:  Vitals:   02/01/19 1730  TempSrc:   PainSc: 0-No pain                 Breonna Gafford,W. EDMOND

## 2019-02-01 NOTE — Progress Notes (Signed)
Pt was informed that her insurance has not approved the left unicompartmental knee surgery.  They approved the right side.  Pt's surgery time was changed today due to that, but as of right now it still hasn't been approved.  Patients surgery case will be delayed and put on the schedule for 1425 to try and allow the insurance time to approve everything so the patient doesn't have to cancel everything today.  Patient was informed of new surgery time and also explained she still may be canceled if the surgery is not approved in time.  Pt voiced understanding to all.  Since the patient will be waiting in the pre-op room for an extended period, it was approved by our charge nurse to allow the patients daughter to come and sit with the patient.  Spoke with Lurline Del by phone and explained the situation and directed her to short stay to sit with her mom.  Explained she may stay as long as she wishes at the pre-op bedside, however she may not come and go frequently, when she leaves she may not return to the pre-op bedside.  Daughter voiced understanding.

## 2019-02-02 ENCOUNTER — Encounter (HOSPITAL_COMMUNITY): Payer: Self-pay | Admitting: Orthopedic Surgery

## 2019-02-02 DIAGNOSIS — M1712 Unilateral primary osteoarthritis, left knee: Secondary | ICD-10-CM | POA: Diagnosis not present

## 2019-02-02 LAB — BASIC METABOLIC PANEL
Anion gap: 4 — ABNORMAL LOW (ref 5–15)
BUN: 12 mg/dL (ref 6–20)
CO2: 26 mmol/L (ref 22–32)
Calcium: 8.6 mg/dL — ABNORMAL LOW (ref 8.9–10.3)
Chloride: 108 mmol/L (ref 98–111)
Creatinine, Ser: 0.77 mg/dL (ref 0.44–1.00)
GFR calc Af Amer: >60 mL/min (ref >60)
GFR calc non Af Amer: >60 mL/min (ref >60)
Glucose, Bld: 147 mg/dL — ABNORMAL HIGH (ref 70–99)
Potassium: 4.9 mmol/L (ref 3.5–5.1)
Sodium: 138 mmol/L (ref 135–145)

## 2019-02-02 LAB — CBC
HCT: 36.8 % (ref 36.0–46.0)
Hemoglobin: 11.4 g/dL — ABNORMAL LOW (ref 12.0–15.0)
MCH: 27.4 pg (ref 26.0–34.0)
MCHC: 31 g/dL (ref 30.0–36.0)
MCV: 88.5 fL (ref 80.0–100.0)
Platelets: 355 10*3/uL (ref 150–400)
RBC: 4.16 MIL/uL (ref 3.87–5.11)
RDW: 16.1 % — ABNORMAL HIGH (ref 11.5–15.5)
WBC: 6.5 10*3/uL (ref 4.0–10.5)
nRBC: 0 % (ref 0.0–0.2)

## 2019-02-02 NOTE — Discharge Summary (Signed)
Physician Discharge Summary  Patient ID: Tamara Malone MRN: [id number] DOB/AGE: 20-Feb-1966 53 y.o.  Admit date: 02/01/2019 Discharge date: 02/02/2019  Admission Diagnoses:  Primary localized osteoarthritis of left knee  Discharge Diagnoses:  Principal Problem:   Primary localized osteoarthritis of left knee   Past Medical History:  Diagnosis Date  . Arthritis   . Hypertension   . Migraines    once very couple months   . Obesity   . Primary localized osteoarthritis of left knee 02/01/2019    Surgeries: Procedure(s): UNICOMPARTMENTAL LEFT KNEE on 02/01/2019   Consultants (if any):   Discharged Condition: Improved  Hospital Course: Tamara Malone is an 53 y.o. female who was admitted 02/01/2019 with a diagnosis of Primary localized osteoarthritis of left knee and went to the operating room on 02/01/2019 and underwent the above named procedures.    She was given perioperative antibiotics:  Anti-infectives (From admission, onward)   Start     Dose/Rate Route Frequency Ordered Stop   02/01/19 2100  ceFAZolin (ANCEF) IVPB 2g/100 mL premix     2 g 200 mL/hr over 30 Minutes Intravenous Every 6 hours 02/01/19 1741 02/02/19 0345   02/01/19 0745  ceFAZolin (ANCEF) IVPB 2g/100 mL premix     2 g 200 mL/hr over 30 Minutes Intravenous On call to O.R. 02/01/19 [id number] 02/01/19 1455    .  She was given sequential compression devices, early ambulation, and aspirin for DVT prophylaxis.  She benefited maximally from the hospital stay and there were no complications.    Recent vital signs:  Vitals:   02/02/19 0120 02/02/19 0553  BP: 125/84 121/85  Pulse: 69 65  Resp: 14 14  Temp: 98.2 F (36.8 C) 98.2 F (36.8 C)  SpO2: 100% 100%    Recent laboratory studies:  Lab Results  Component Value Date   HGB 11.4 (L) 02/02/2019   HGB 12.1 01/25/2019   HGB 13.3 03/13/2008   Lab Results  Component Value Date   WBC 6.5 02/02/2019   PLT 355 02/02/2019   No results found for: INR Lab  Results  Component Value Date   NA 138 02/02/2019   K 4.9 02/02/2019   CL 108 02/02/2019   CO2 26 02/02/2019   BUN 12 02/02/2019   CREATININE 0.77 02/02/2019   GLUCOSE 147 (H) 02/02/2019    Discharge Medications:   Allergies as of 02/02/2019   No Known Allergies     Medication List    STOP taking these medications   phentermine 37.5 MG tablet Commonly known as: ADIPEX-P     TAKE these medications   aspirin EC 325 MG tablet Take 1 tablet (325 mg total) by mouth 2 (two) times daily.   baclofen 10 MG tablet Commonly known as: LIORESAL Take 1 tablet (10 mg total) by mouth 3 (three) times daily. As needed for muscle spasm   hydrochlorothiazide 12.5 MG tablet Commonly known as: HYDRODIURIL Take 12.5 mg by mouth 2 (two) times daily.   HYDROcodone-acetaminophen 10-325 MG tablet Commonly known as: Norco Take 1 tablet by mouth every 6 (six) hours as needed.   ondansetron 4 MG tablet Commonly known as: Zofran Take 1 tablet (4 mg total) by mouth every 8 (eight) hours as needed for nausea or vomiting.   Qudexy XR 150 MG Cs24 sprinkle capsule Generic drug: topiramate ER Take 150 mg by mouth daily.   sennosides-docusate sodium 8.6-50 MG tablet Commonly known as: SENOKOT-S Take 2 tablets by mouth daily.  Diagnostic Studies: Dg Knee Left Port  Result Date: 02/01/2019 CLINICAL DATA:  LEFT unicompartmental knee replacement. EXAM: PORTABLE LEFT KNEE - 1-2 VIEW COMPARISON:  None. FINDINGS: MEDIAL compartment arthroplasty changes noted. No acute fracture or dislocation. No gross complicating features are present. IMPRESSION: MEDIAL compartment arthroplasty changes without definite complicating features. Electronically Signed   By: Margarette Canada M.D.   On: 02/01/2019 19:02    Disposition:     Follow-up Information    Marchia Bond, MD. Schedule an appointment as soon as possible for a visit in 2 weeks.   Specialty: Orthopedic Surgery Contact information: [address] [place] [address] [phone]            Signed: Johnny Bridge 02/02/2019, 8:26 AM

## 2019-02-02 NOTE — Plan of Care (Signed)
  Problem: Education: Goal: Knowledge of General Education information will improve Description: Including pain rating scale, medication(s)/side effects and non-pharmacologic comfort measures Outcome: Progressing   Problem: Clinical Measurements: Goal: Will remain free from infection Outcome: Progressing Goal: Respiratory complications will improve Outcome: Progressing Goal: Cardiovascular complication will be avoided Outcome: Progressing   Problem: Coping: Goal: Level of anxiety will decrease Outcome: Progressing   Problem: Pain Managment: Goal: General experience of comfort will improve Outcome: Progressing   Problem: Safety: Goal: Ability to remain free from injury will improve Outcome: Progressing   

## 2019-02-02 NOTE — Progress Notes (Signed)
Physical Therapy Treatment Patient Details Name: Tamara Malone MRN: [id number] DOB: 10/14/1965 Today's Date: 02/02/2019    History of Present Illness Pt is a 53 year old female s/p left medial unicompartmental knee arthroplasty    PT Comments    Pt ambulated in hallway and practiced safe stair technique.  Pt reports pain well controlled and she feels ready for d/c home today.  Pt provided with HEP handout.   Follow Up Recommendations  Follow surgeon's recommendation for DC plan and follow-up therapies     Equipment Recommendations  Rolling walker with 5" wheels    Recommendations for Other Services       Precautions / Restrictions Precautions Precautions: Knee;Fall Restrictions Other Position/Activity Restrictions: WBAT    Mobility  Bed Mobility Overal bed mobility: Needs Assistance Bed Mobility: Supine to Sit     Supine to sit: Min guard     General bed mobility comments: pt up in recliner on arrival  Transfers Overall transfer level: Needs assistance Equipment used: Rolling walker (2 wheeled) Transfers: Sit to/from Stand Sit to Stand: Supervision         General transfer comment: verbal cues for UE and LE positioning  Ambulation/Gait Ambulation/Gait assistance: Supervision Gait Distance (Feet): 160 Feet Assistive device: Rolling walker (2 wheeled) Gait Pattern/deviations: Step-through pattern;Decreased stride length;Decreased stance time - left;Antalgic     General Gait Details: verbal cues for sequencing, RW Positioning, posture   Stairs Stairs: Yes Stairs assistance: Min guard Stair Management: One rail Left;Forwards;Step to pattern Number of Stairs: 3 General stair comments: verbal cues for safety and sequence, rail on left and pt will use spouse HHA on right so provided HHA (pt barely placing weight on right though); performed twice; pt feels confident with stairs   Wheelchair Mobility    Modified Rankin (Stroke Patients Only)        Balance                                            Cognition Arousal/Alertness: Awake/alert Behavior During Therapy: WFL for tasks assessed/performed Overall Cognitive Status: Within Functional Limits for tasks assessed                                           General Comments        Pertinent Vitals/Pain Pain Assessment: 0-10 Pain Score: 3  Pain Location: left knee Pain Descriptors / Indicators: Aching;Sore Pain Intervention(s): Monitored during session;Premedicated before session;Repositioned    Home Living Family/patient expects to be discharged to:: Private residence Living Arrangements: Spouse/significant other   Type of Home: House     Home Layout: Two level;Bed/bath upstairs Home Equipment: Walker - 2 wheels      Prior Function Level of Independence: Independent          PT Goals (current goals can now be found in the care plan section) Acute Rehab PT Goals PT Goal Formulation: With patient Time For Goal Achievement: 02/05/19 Potential to Achieve Goals: Good Progress towards PT goals: Progressing toward goals    Frequency    7X/week      PT Plan Current plan remains appropriate    Co-evaluation              AM-PAC PT "6 Clicks" Mobility   Outcome Measure  Help needed turning from your back to your side while in a flat bed without using bedrails?: A Little Help needed moving from lying on your back to sitting on the side of a flat bed without using bedrails?: A Little Help needed moving to and from a bed to a chair (including a wheelchair)?: A Little Help needed standing up from a chair using your arms (e.g., wheelchair or bedside chair)?: A Little Help needed to walk in hospital room?: A Little Help needed climbing 3-5 steps with a railing? : A Little 6 Click Score: 18    End of Session Equipment Utilized During Treatment: Gait belt Activity Tolerance: Patient tolerated treatment well Patient left:  in chair;with call bell/phone within reach Nurse Communication: Mobility status PT Visit Diagnosis: Other abnormalities of gait and mobility (R26.89)     Time: 1400-1410 PT Time Calculation (min) (ACUTE ONLY): 10 min  Charges:  $Gait Training: 8-22 mins                    Carmelia Bake, PT, DPT Acute Rehabilitation Services Office: [phone] Pager: [phone]  Trena Platt 02/02/2019, 3:01 PM

## 2019-02-02 NOTE — TOC Initial Note (Signed)
Transition of Care [facility]) - Initial/Assessment Note    Patient Details  Name: LEAH GATT MRN: [id number] Date of Birth: [birth year]  [facility] CM/SW Contact:    Leeroy Cha, RN Phone Number: 02/02/2019, 10:21 AM  Clinical Narrative:                 Riccardo Dubin home with self care.  Expected Discharge Plan: Home/Self Care     Patient Goals and CMS Choice        Expected Discharge Plan and Services Expected Discharge Plan: Home/Self Care         Expected Discharge Date: 02/02/19                                    Prior Living Arrangements/Services     Patient language and need for interpreter reviewed:: No Do you feel safe going back to the place where you live?: Yes               Activities of Daily Living Home Assistive Devices/Equipment: Eyeglasses ADL Screening (condition at time of admission) Patient's cognitive ability adequate to safely complete daily activities?: Yes Is the patient deaf or have difficulty hearing?: No Does the patient have difficulty seeing, even when wearing glasses/contacts?: No Does the patient have difficulty concentrating, remembering, or making decisions?: No Patient able to express need for assistance with ADLs?: Yes Does the patient have difficulty dressing or bathing?: No Independently performs ADLs?: Yes (appropriate for developmental age) Does the patient have difficulty walking or climbing stairs?: No Weakness of Legs: Both Weakness of Arms/Hands: None  Permission Sought/Granted                  Emotional Assessment Appearance:: Appears stated age     Orientation: : Oriented to Self, Oriented to Place, Oriented to  Time, Oriented to Situation Alcohol / Substance Use: Not Applicable Psych Involvement: No (comment)  Admission diagnosis:  OA LEFT  KNEE Patient Active Problem List   Diagnosis Date Noted  . Primary localized osteoarthritis of left knee 02/01/2019   PCP:  Haywood Pao, MD Pharmacy:   [facility] [address] [place] [address] Phone: [phone] Fax: [phone]     Social Determinants of Health (SDOH) Interventions    Readmission Risk Interventions No flowsheet data found.

## 2019-02-02 NOTE — Evaluation (Signed)
Physical Therapy Evaluation Patient Details Name: Tamara Malone MRN: [id number] DOB: Oct 15, 1965 Today's Date: 02/02/2019   History of Present Illness  Pt is a 53 year old female s/p left medial unicompartmental knee arthroplasty  Clinical Impression  Pt is s/p surgery resulting in the deficits listed below (see PT Problem List).  Pt will benefit from skilled PT to increase their independence and safety with mobility to allow discharge to the venue listed below.   Pt ambulated in hallway and performed exercises POD #1.  Pt likely to d/c home later today.  Will return to practice safe stair technique.     Follow Up Recommendations Follow surgeon's recommendation for DC plan and follow-up therapies    Equipment Recommendations  Rolling walker with 5" wheels    Recommendations for Other Services       Precautions / Restrictions Precautions Precautions: Knee;Fall Restrictions Other Position/Activity Restrictions: WBAT      Mobility  Bed Mobility Overal bed mobility: Needs Assistance Bed Mobility: Supine to Sit     Supine to sit: Min guard     General bed mobility comments: min/guard for safety  Transfers Overall transfer level: Needs assistance Equipment used: Rolling walker (2 wheeled) Transfers: Sit to/from Stand Sit to Stand: Min guard         General transfer comment: verbal cues for UE and LE positioning  Ambulation/Gait Ambulation/Gait assistance: Min guard Gait Distance (Feet): 160 Feet Assistive device: Rolling walker (2 wheeled) Gait Pattern/deviations: Step-through pattern;Decreased stride length;Decreased stance time - left;Antalgic     General Gait Details: verbal cues for sequencing, RW Positioning, posture  Stairs            Wheelchair Mobility    Modified Rankin (Stroke Patients Only)       Balance                                             Pertinent Vitals/Pain Pain Assessment: 0-10 Pain Score: 3  Pain  Location: left knee Pain Descriptors / Indicators: Aching;Sore Pain Intervention(s): Monitored during session;Premedicated before session;Repositioned    Home Living Family/patient expects to be discharged to:: Private residence Living Arrangements: Spouse/significant other   Type of Home: House       Home Layout: Two level;Bed/bath upstairs Home Equipment: Walker - 2 wheels      Prior Function Level of Independence: Independent               Hand Dominance        Extremity/Trunk Assessment        Lower Extremity Assessment Lower Extremity Assessment: LLE deficits/detail LLE Deficits / Details: unable to perform SLR, able to perform ankle pumps, approx 60* AAROM knee flexion       Communication   Communication: No difficulties  Cognition Arousal/Alertness: Awake/alert Behavior During Therapy: WFL for tasks assessed/performed Overall Cognitive Status: Within Functional Limits for tasks assessed                                        General Comments      Exercises Total Joint Exercises Ankle Circles/Pumps: AROM;Both;10 reps Quad Sets: AROM;Both;10 reps Short Arc Quad: AROM;Left;10 reps Heel Slides: AAROM;Left;10 reps Hip ABduction/ADduction: AAROM;Left;10 reps Straight Leg Raises: AAROM;Left;10 reps Knee Flexion: AAROM;Seated;Left;10 reps   Assessment/Plan  PT Assessment Patient needs continued PT services  PT Problem List Decreased range of motion;Decreased strength;Decreased mobility;Decreased balance;Pain;Decreased knowledge of precautions       PT Treatment Interventions Gait training;Stair training;Balance training;DME instruction;Therapeutic exercise;Functional mobility training;Therapeutic activities;Patient/family education    PT Goals (Current goals can be found in the Care Plan section)  Acute Rehab PT Goals PT Goal Formulation: With patient Time For Goal Achievement: 02/05/19 Potential to Achieve Goals: Good     Frequency 7X/week   Barriers to discharge        Co-evaluation               AM-PAC PT "6 Clicks" Mobility  Outcome Measure Help needed turning from your back to your side while in a flat bed without using bedrails?: A Little Help needed moving from lying on your back to sitting on the side of a flat bed without using bedrails?: A Little Help needed moving to and from a bed to a chair (including a wheelchair)?: A Little Help needed standing up from a chair using your arms (e.g., wheelchair or bedside chair)?: A Little Help needed to walk in hospital room?: A Little Help needed climbing 3-5 steps with a railing? : A Little 6 Click Score: 18    End of Session Equipment Utilized During Treatment: Gait belt Activity Tolerance: Patient tolerated treatment well Patient left: in chair;with call bell/phone within reach   PT Visit Diagnosis: Other abnormalities of gait and mobility (R26.89)    [id number] PT Time Calculation (min) (ACUTE ONLY): 18 min   Charges:   PT Evaluation $PT Eval Low Complexity: [place], PT, DPT Acute Rehabilitation Services Office: [phone] Pager: [phone]  Trena Platt 02/02/2019, 2:55 PM

## 2020-11-06 ENCOUNTER — Other Ambulatory Visit: Payer: Self-pay | Admitting: Internal Medicine

## 2020-11-06 DIAGNOSIS — Z1231 Encounter for screening mammogram for malignant neoplasm of breast: Secondary | ICD-10-CM

## 2020-11-09 ENCOUNTER — Other Ambulatory Visit (HOSPITAL_COMMUNITY): Payer: Self-pay | Admitting: Surgery

## 2020-11-16 ENCOUNTER — Ambulatory Visit (HOSPITAL_COMMUNITY)
Admission: RE | Admit: 2020-11-16 | Discharge: 2020-11-16 | Disposition: A | Discharge status: Home / Self Care | Payer: 59 | Source: Ambulatory Visit | Attending: Surgery | Admitting: Surgery

## 2020-11-16 ENCOUNTER — Other Ambulatory Visit: Payer: Self-pay

## 2020-12-25 ENCOUNTER — Encounter: Payer: 59 | Attending: Surgery | Admitting: Skilled Nursing Facility1

## 2020-12-25 ENCOUNTER — Other Ambulatory Visit: Payer: Self-pay

## 2020-12-25 ENCOUNTER — Encounter: Payer: Self-pay | Admitting: Skilled Nursing Facility1

## 2020-12-25 DIAGNOSIS — E669 Obesity, unspecified: Secondary | ICD-10-CM | POA: Diagnosis present

## 2020-12-25 NOTE — Progress Notes (Signed)
Nutrition Assessment for Bariatric Surgery Medical Nutrition Therapy Appt Start Time: 8:38    End Time: 9:38  Patient was seen on 12/25/2020 for Pre-Operative Nutrition Assessment. Letter of approval faxed to [facility] bariatric surgery program coordinator on 12/25/2020.   Referral stated Supervised Weight Loss (SWL) visits needed: 0  Pt completed visits.   Pt has cleared nutrition requirements.   Planned surgery: RYGB Pt expectation of surgery: to lose weight  Pt expectation of dietitian: none stated    NUTRITION ASSESSMENT   Anthropometrics  Start weight at [facility]: 246.4 lbs (date: 12/25/2020)  Height: 64 in BMI: 42.33 kg/m2     Clinical  Medical hx: HTN, arthritis  Medications: HTZ  Labs: none updated in EMR Notable signs/symptoms: knee pain, migraines  Any previous deficiencies? No  Micronutrient Nutrition Focused Physical Exam: Hair: No issues observed Eyes: No issues observed Mouth: No issues observed Neck: No issues observed Nails: No issues observed Skin: No issues observed  Lifestyle & Dietary Hx  Pt states she works 7 days a week 3 of those days 12 hours and 8 hours the other, gets a 10 minute break after 2 hours then a 15 then a 30 for lunch then 15 then 10 minute break. Pt states she gets every holiday off not including in her 12 days off a YEAR.    24-Hr Dietary Recall: 2 nut packs a day First Meal: protein shake Snack: banana or applesauce Second Meal: baked chicken + vegetables + fruit cup Snack: almonds Third Meal: protein shake Snack:  Beverages: soda, water, lemonade, sweet tea   Estimated Energy Needs Calories: 1500   NUTRITION DIAGNOSIS  Overweight/obesity ([place]-3.3) related to past poor dietary habits and physical inactivity as evidenced by patient w/ planned RYGB surgery following dietary guidelines for continued weight loss.    NUTRITION INTERVENTION  Nutrition counseling (C-1) and education (E-2) to facilitate bariatric  surgery goals.  Educated pt on micronutrient deficiencies post surgery and strategies to mitigate that risk   Pre-Op Goals Reviewed with the Patient Track food and beverage intake (pen and paper, MyFitness Pal, Baritastic app, etc.) Make healthy food choices while monitoring portion sizes Consume 3 meals per day or try to eat every 3-5 hours Avoid concentrated sugars and fried foods Keep sugar & fat in the single digits per serving on food labels Practice CHEWING your food (aim for applesauce consistency) Practice not drinking 15 minutes before, during, and 30 minutes after each meal and snack Avoid all carbonated beverages (ex: soda, sparkling beverages)  Limit caffeinated beverages (ex: coffee, tea, energy drinks) Avoid all sugar-sweetened beverages (ex: regular soda, sports drinks)  Avoid alcohol  Aim for 64-100 ounces of FLUID daily (with at least half of fluid intake being plain water)  Aim for at least 60-80 grams of PROTEIN daily Look for a liquid protein source that contains ?15 g protein and ?5 g carbohydrate (ex: shakes, drinks, shots) Make a list of non-food related activities Physical activity is an important part of a healthy lifestyle so keep it moving! The goal is to reach 150 minutes of exercise per week, including cardiovascular and weight baring activity:  Stop working those 12 hour shifts  Get a vitamin D supplement   *Goals that are bolded indicate the pt would like to start working towards these  Handouts Provided Include  Bariatric Surgery handouts (Nutrition Visits, Pre-Op Goals, Protein Shakes, Vitamins & Minerals)  Learning Style & Readiness for Change Teaching method utilized: Visual & Auditory  Demonstrated degree  of understanding via: Teach Back  Readiness Level: Action Barriers to learning/adherence to lifestyle change: work schedule   MONITORING & EVALUATION Dietary intake, weekly physical activity, body weight, and pre-op goals reached at next  nutrition visit.    Next Steps  Patient is to follow up at [place] for Pre-Op Class >2 weeks before surgery for further nutrition education.   Pt has completed visits. No further supervised visits required/recomended

## 2020-12-26 ENCOUNTER — Ambulatory Visit: Payer: 59 | Admitting: Skilled Nursing Facility1

## 2021-01-29 ENCOUNTER — Ambulatory Visit: Payer: Self-pay | Admitting: Surgery

## 2021-02-15 ENCOUNTER — Encounter (HOSPITAL_COMMUNITY): Payer: Self-pay | Admitting: Surgery

## 2021-02-26 ENCOUNTER — Encounter (HOSPITAL_COMMUNITY)
Admission: RE | Disposition: A | Discharge status: Home / Self Care | Payer: Self-pay | Source: Home / Self Care | Attending: Surgery

## 2021-02-26 ENCOUNTER — Ambulatory Visit (HOSPITAL_COMMUNITY): Payer: 59 | Admitting: Certified Registered Nurse Anesthetist

## 2021-02-26 ENCOUNTER — Encounter (HOSPITAL_COMMUNITY): Payer: Self-pay | Admitting: Surgery

## 2021-02-26 ENCOUNTER — Other Ambulatory Visit: Payer: Self-pay

## 2021-02-26 ENCOUNTER — Ambulatory Visit (HOSPITAL_COMMUNITY)
Admission: RE | Admit: 2021-02-26 | Discharge: 2021-02-26 | Disposition: A | Discharge status: Home / Self Care | Payer: 59 | Attending: Surgery | Admitting: Surgery

## 2021-02-26 DIAGNOSIS — Z803 Family history of malignant neoplasm of breast: Secondary | ICD-10-CM | POA: Insufficient documentation

## 2021-02-26 DIAGNOSIS — K449 Diaphragmatic hernia without obstruction or gangrene: Secondary | ICD-10-CM | POA: Diagnosis not present

## 2021-02-26 DIAGNOSIS — Z01818 Encounter for other preprocedural examination: Secondary | ICD-10-CM | POA: Diagnosis not present

## 2021-02-26 DIAGNOSIS — Z823 Family history of stroke: Secondary | ICD-10-CM | POA: Diagnosis not present

## 2021-02-26 DIAGNOSIS — Z79899 Other long term (current) drug therapy: Secondary | ICD-10-CM | POA: Insufficient documentation

## 2021-02-26 DIAGNOSIS — K2289 Other specified disease of esophagus: Secondary | ICD-10-CM | POA: Insufficient documentation

## 2021-02-26 DIAGNOSIS — Z833 Family history of diabetes mellitus: Secondary | ICD-10-CM | POA: Diagnosis not present

## 2021-02-26 DIAGNOSIS — Z6841 Body Mass Index (BMI) 40.0 and over, adult: Secondary | ICD-10-CM | POA: Insufficient documentation

## 2021-02-26 DIAGNOSIS — Z8349 Family history of other endocrine, nutritional and metabolic diseases: Secondary | ICD-10-CM | POA: Diagnosis not present

## 2021-02-26 DIAGNOSIS — K219 Gastro-esophageal reflux disease without esophagitis: Secondary | ICD-10-CM | POA: Diagnosis not present

## 2021-02-26 DIAGNOSIS — Z8249 Family history of ischemic heart disease and other diseases of the circulatory system: Secondary | ICD-10-CM | POA: Insufficient documentation

## 2021-02-26 DIAGNOSIS — K2970 Gastritis, unspecified, without bleeding: Secondary | ICD-10-CM | POA: Insufficient documentation

## 2021-02-26 HISTORY — PX: BIOPSY: SHX5522

## 2021-02-26 HISTORY — PX: ESOPHAGOGASTRODUODENOSCOPY: SHX5428

## 2021-02-26 SURGERY — EGD (ESOPHAGOGASTRODUODENOSCOPY)
Anesthesia: Monitor Anesthesia Care

## 2021-02-26 MED ORDER — PROPOFOL 500 MG/50ML IV EMUL
INTRAVENOUS | Status: DC | PRN
  Administered 2021-02-26: 125 ug/kg/min via INTRAVENOUS

## 2021-02-26 MED ORDER — LIDOCAINE 2% (20 MG/ML) 5 ML SYRINGE
INTRAMUSCULAR | Status: DC | PRN
  Administered 2021-02-26: 100 mg via INTRAVENOUS

## 2021-02-26 MED ORDER — PROPOFOL 1000 MG/100ML IV EMUL
INTRAVENOUS | Status: AC
  Filled 2021-02-26: qty 100

## 2021-02-26 MED ORDER — PROPOFOL 10 MG/ML IV BOLUS
INTRAVENOUS | Status: DC | PRN
  Administered 2021-02-26: 30 mg via INTRAVENOUS
  Administered 2021-02-26: 40 mg via INTRAVENOUS
  Administered 2021-02-26: 20 mg via INTRAVENOUS

## 2021-02-26 MED ORDER — LACTATED RINGERS IV SOLN
INTRAVENOUS | Status: AC | PRN
  Administered 2021-02-26: 1000 mL via INTRAVENOUS

## 2021-02-26 NOTE — Transfer of Care (Signed)
Immediate Anesthesia Transfer of Care Note  Patient: [name]  Procedure(s) Performed: ESOPHAGOGASTRODUODENOSCOPY (EGD)  Patient Location: Endoscopy Unit  Anesthesia Type:MAC  Level of Consciousness: drowsy and patient cooperative  Airway & Oxygen Therapy: Patient Spontanous Breathing and Patient connected to face mask oxygen  Post-op Assessment: Report given to RN and Post -op Vital signs reviewed and stable  Post vital signs: Reviewed and stable  Last Vitals:  Vitals Value Taken Time  BP 149/86 02/26/21 1401  Temp    Pulse 71 02/26/21 1403  Resp 19 02/26/21 1403  SpO2 100 % 02/26/21 1403  Vitals shown include unvalidated device data.  Last Pain:  Vitals:   02/26/21 1401  TempSrc:   PainSc: 0-No pain         Complications: No notable events documented.

## 2021-02-26 NOTE — Discharge Instructions (Signed)
YOU HAD AN ENDOSCOPIC PROCEDURE TODAY: Refer to the procedure report and other information in the discharge instructions given to you for any specific questions about what was found during the examination. If this information does not answer your questions, please call [facility] office at [phone] to clarify.   YOU SHOULD EXPECT: Some feelings of bloating in the abdomen. Passage of more gas than usual. Walking can help get rid of the air that was put into your GI tract during the procedure and reduce the bloating.  DIET: Your first meal following the procedure should be a light meal and then it is ok to progress to your normal diet. A half-sandwich or bowl of soup is an example of a good first meal. Heavy or fried foods are harder to digest and may make you feel nauseous or bloated. Drink plenty of fluids but you should avoid alcoholic beverages for 24 hours.  ACTIVITY: Your care partner should take you home directly after the procedure. You should plan to take it easy, moving slowly for the rest of the day. You can resume normal activity the day after the procedure however YOU SHOULD NOT DRIVE, use power tools, machinery or perform tasks that involve climbing or major physical exertion for 24 hours (because of the sedation medicines used during the test).   SYMPTOMS TO REPORT IMMEDIATELY: A general surgeon can be reached at any hour. Please call [phone]  for any of the following symptoms:   Following upper endoscopy (EGD, EUS, ERCP, esophageal dilation) Vomiting of blood or coffee ground material  New, significant abdominal pain  New, significant chest pain or pain under the shoulder blades  Painful or persistently difficult swallowing  New shortness of breath  Black, tarry-looking or red, bloody stools  FOLLOW UP:  If any biopsies were taken you will be contacted by phone or by letter within the next 1-3 weeks. Call [phone]  if you have not heard about the biopsies  in 3 weeks.  Please also call with any specific questions about appointments or follow up tests.

## 2021-02-26 NOTE — H&P (Signed)
Admitting Physician: [place]  Service: Bariatric Surgery  CC: Morbid Obesity  Subjective   HPI: Tamara Malone is an 55 y.o. female who is here for elective EGD prior to bariatric surgery  Past Medical History:  Diagnosis Date   Arthritis    Hypertension    Migraines    once very couple months    Obesity    Primary localized osteoarthritis of left knee 02/01/2019    Past Surgical History:  Procedure Laterality Date   ABDOMINAL HYSTERECTOMY     PARTIAL KNEE ARTHROPLASTY Left 02/01/2019   Procedure: UNICOMPARTMENTAL LEFT KNEE;  Surgeon: Marchia Bond, MD;  Location: [facility];  Service: Orthopedics;  Laterality: Left;    Family History  Problem Relation Age of Onset   Hypertension Other    Hyperlipidemia Other    Diabetes Other    Stroke Other    Heart disease Other    Diabetes Mother    Diabetes Sister    Diabetes Brother    Breast cancer Maternal Aunt    Heart disease Maternal Grandmother    Colon cancer Neg Hx     Social:  reports that she has never smoked. She has never used smokeless tobacco. She reports that she does not drink alcohol and does not use drugs.  Allergies: No Known Allergies  Medications: Current Outpatient Medications  Medication Instructions   hydrochlorothiazide (HYDRODIURIL) 12.5 mg, Oral, 2 times daily    ROS - all of the below systems have been reviewed with the patient and positives are indicated with bold text General: chills, fever or night sweats Eyes: blurry vision or double vision ENT: epistaxis or sore throat Allergy/Immunology: itchy/watery eyes or nasal congestion Hematologic/Lymphatic: bleeding problems, blood clots or swollen lymph nodes Endocrine: temperature intolerance or unexpected weight changes Breast: new or changing breast lumps or nipple discharge Resp: cough, shortness of breath, or wheezing CV: chest pain or dyspnea on exertion GI: as per HPI GU: dysuria, trouble voiding, or hematuria MSK:  joint pain or joint stiffness Neuro: TIA or stroke symptoms Derm: pruritus and skin lesion changes Psych: anxiety and depression  Objective   PE There were no vitals taken for this visit. Constitutional: NAD; conversant; no deformities Eyes: Moist conjunctiva; no lid lag; anicteric; PERRL Neck: Trachea midline; no thyromegaly Lungs: Normal respiratory effort; no tactile fremitus CV: RRR; no palpable thrills; no pitting edema GI: Abd Soft, non-tender; no palpable hepatosplenomegaly MSK: Normal range of motion of extremities; no clubbing/cyanosis Psychiatric: Appropriate affect; alert and oriented x3 Lymphatic: No palpable cervical or axillary lymphadenopathy  No results found for this or any previous visit (from the past 24 hour(s)).  Imaging Orders  No imaging studies ordered today     Assessment and Plan   Tamara Malone is an 55 y.o. female with morbid obesity, BMI 42.33, and the following comorbidity related to obesity: hypertension.  Tamara Malone presented for bariatric surgery consultation. I reviewed the current bariatric surgeries available, and specifically the ones I offer including robotic sleeve gastrectomy robotic Roux-en-Y gastric bypass. I entered her information into the [facility] metabolic surgery risk/benefit calculator. After full discussion and all questions answered, the patient is interested in Roux-en-Y gastric bypass for the larger amount of weight loss.   The preoperative pathway included the following: - Bloodwork - Dietitian consult - complete with Sandie Ano, RD - Chest x-ray 11/16/20 - No active cardiopulmonary disease - EKG - NSR - Sleep study - Psychology evaluation - Upper endoscopy with biopsy  Today  she presents for upper endoscopy.  The procedure itself as well as its risks, benefits and alternatives were discussed and the patient granted consent to proceed.  We will proceed as scheduled.   Felicie Morn, MD  [facility] Use [email or web address] to contact on call provider

## 2021-02-26 NOTE — Op Note (Signed)
[facility] Patient Name: Tamara Malone Procedure Date: 02/26/2021 MRN: [id number] Attending MD: Felicie Morn ,  Date of Birth: [birth year] CSN: [id number] Age: 55 Admit Type: Outpatient Procedure:                Upper GI endoscopy Indications:              Screening procedure, Morbid obesity Providers:                Felicie Morn, Mikey College, RN, Janie                            [place], Technician, Luan Moore, Technician,                            Adair Laundry, CRNA Referring MD:              Medicines:                Monitored Anesthesia Care Complications:            No immediate complications. Estimated blood loss:                            Minimal. Estimated Blood Loss:     Estimated blood loss was minimal. Procedure:                Pre-Anesthesia Assessment:                           - Prior to the procedure, a History and Physical                            was performed, and patient medications and                            allergies were reviewed. The patient is competent.                            The risks and benefits of the procedure and the                            sedation options and risks were discussed with the                            patient. All questions were answered and informed                            consent was obtained. Patient identification and                            proposed procedure were verified by the physician                            in the pre-procedure area. ASA Grade Assessment: II                            -  A patient with mild systemic disease. After                            reviewing the risks and benefits, the patient was                            deemed in satisfactory condition to undergo the                            procedure. The anesthesia plan was to use monitored                            anesthesia care (MAC). Immediately prior to                             administration of medications, the patient was                            re-assessed for adequacy to receive sedatives. The                            heart rate, respiratory rate, oxygen saturations,                            blood pressure, adequacy of pulmonary ventilation,                            and response to care were monitored throughout the                            procedure. The physical status of the patient was                            re-assessed after the procedure.                           After obtaining informed consent, the endoscope was                            passed under direct vision. Throughout the                            procedure, the patient's blood pressure, pulse, and                            oxygen saturations were monitored continuously. The                            GIF-H190 ([id number]) Olympus endoscope was introduced                            through the mouth, and advanced to the second part  of duodenum. The upper GI endoscopy was                            accomplished without difficulty. The patient                            tolerated the procedure well. Scope In: Scope Out: Findings:      A small hiatal hernia was present.      The gastroesophageal flap valve was visualized endoscopically and       classified as Hill Grade III (minimal fold, loose to endoscope, hiatal       hernia likely).      Localized mild inflammation characterized by congestion (edema) was       found in the prepyloric region of the stomach. Biopsies were taken with       a cold forceps for Helicobacter pylori testing. Verification of patient       identification for the specimen was done. Estimated blood loss was       minimal.      The in the duodenum was normal. Impression:               - Small hiatal hernia.                           - Gastroesophageal flap valve classified as Hill                            Grade III (minimal  fold, loose to endoscope, hiatal                            hernia likely).                           - Gastritis. Biopsied.                           - Normal. Moderate Sedation:      Moderate (conscious) sedation was personally administered by an       anesthesia professional. The following parameters were monitored: oxygen       saturation, heart rate, blood pressure, and response to care. Total       physician intraservice time was 15 minutes. Recommendation:           - Discharge patient to home.                           - Resume previous diet.                           - Continue present medications.                           - Await pathology results. Procedure Code(s):        --- Professional ---                           [phone], Esophagogastroduodenoscopy, flexible,  transoral; with biopsy, single or multiple Diagnosis Code(s):        --- Professional ---                           K44.9, Diaphragmatic hernia without obstruction or                            gangrene                           K29.70, Gastritis, unspecified, without bleeding                           Z13.810, Encounter for screening for upper                            gastrointestinal disorder                           E66.01, Morbid (severe) obesity due to excess                            calories CPT copyright 2019 [employer]. All rights reserved. The codes documented in this report are preliminary and upon coder review may  be revised to meet current compliance requirements. [place],  02/26/2021 2:04:23 PM This report has been signed electronically. Number of Addenda: 0

## 2021-02-26 NOTE — Anesthesia Preprocedure Evaluation (Addendum)
Anesthesia Evaluation  Patient identified by MRN, date of birth, ID band Patient awake    Reviewed: Allergy & Precautions, NPO status , Patient's Chart, lab work & pertinent test results  History of Anesthesia Complications Negative for: history of anesthetic complications  Airway Mallampati: I  TM Distance: >3 FB Neck ROM: Full    Dental  (+) Dental Advisory Given   Pulmonary neg pulmonary ROS,    breath sounds clear to auscultation       Cardiovascular hypertension, Pt. on medications (-) angina Rhythm:Regular Rate:Normal     Neuro/Psych  Headaches,    GI/Hepatic Neg liver ROS, GERD  Controlled,  Endo/Other  Morbid obesity  Renal/GU negative Renal ROS     Musculoskeletal  (+) Arthritis ,   Abdominal (+) + obese,   Peds  Hematology negative hematology ROS (+)   Anesthesia Other Findings   Reproductive/Obstetrics                            Anesthesia Physical Anesthesia Plan  ASA: 2  Anesthesia Plan: MAC   Post-op Pain Management:    Induction:   PONV Risk Score and Plan: 2 and Ondansetron  Airway Management Planned: Natural Airway and Nasal Cannula  Additional Equipment: None  Intra-op Plan:   Post-operative Plan:   Informed Consent: I have reviewed the patients History and Physical, chart, labs and discussed the procedure including the risks, benefits and alternatives for the proposed anesthesia with the patient or authorized representative who has indicated his/her understanding and acceptance.     Dental advisory given  Plan Discussed with: CRNA and Surgeon  Anesthesia Plan Comments:        Anesthesia Quick Evaluation

## 2021-02-26 NOTE — Anesthesia Postprocedure Evaluation (Signed)
Anesthesia Post Note  Patient: Tamara Malone  Procedure(s) Performed: ESOPHAGOGASTRODUODENOSCOPY (EGD)     Patient location during evaluation: Endoscopy Anesthesia Type: MAC Level of consciousness: awake and alert, patient cooperative and oriented Pain management: pain level controlled Vital Signs Assessment: post-procedure vital signs reviewed and stable Respiratory status: spontaneous breathing, nonlabored ventilation and respiratory function stable Cardiovascular status: blood pressure returned to baseline and stable Postop Assessment: no apparent nausea or vomiting and able to ambulate Anesthetic complications: no   No notable events documented.  Last Vitals:  Vitals:   02/26/21 1411 02/26/21 1421  BP: (!) 150/95 (!) 154/94  Pulse: 77 64  Resp: 20 15  Temp:    SpO2: 98% 96%    Last Pain:  Vitals:   02/26/21 1421  TempSrc:   PainSc: 0-No pain                 Rik Wadel,E. Adley Castello

## 2021-02-27 ENCOUNTER — Encounter: Payer: Self-pay | Admitting: *Deleted

## 2021-02-28 ENCOUNTER — Ambulatory Visit: Payer: 59 | Admitting: Neurology

## 2021-02-28 ENCOUNTER — Encounter: Payer: Self-pay | Admitting: Neurology

## 2021-02-28 VITALS — BP 140/91 | HR 76 | Ht 64.0 in | Wt 257.0 lb

## 2021-02-28 DIAGNOSIS — J343 Hypertrophy of nasal turbinates: Secondary | ICD-10-CM | POA: Diagnosis not present

## 2021-02-28 DIAGNOSIS — R351 Nocturia: Secondary | ICD-10-CM

## 2021-02-28 DIAGNOSIS — R5383 Other fatigue: Secondary | ICD-10-CM

## 2021-02-28 DIAGNOSIS — Z6841 Body Mass Index (BMI) 40.0 and over, adult: Secondary | ICD-10-CM

## 2021-02-28 DIAGNOSIS — Z9189 Other specified personal risk factors, not elsewhere classified: Secondary | ICD-10-CM

## 2021-02-28 LAB — SURGICAL PATHOLOGY

## 2021-02-28 NOTE — Progress Notes (Signed)
Subjective:    Patient ID: Tamara Malone is a 55 y.o. female.  HPI    Star Age, MD, PhD [facility] [address] [place], [place] [address]  Dear Dr. Thermon Leyland,   I saw your patient, Tamara Malone, upon your kind request in sleep clinic today for initial consultation of her sleep disorder, in particular, concern for underlying obstructive sleep apnea.  The patient is unaccompanied today.  As you know, Tamara Malone is a 55 year old right-handed woman with an underlying medical history of hypertension, arthritis, migraine headaches, and morbid obesity with a BMI of over 40, who reports occasional daytime tiredness after work.  She is being evaluated for bariatric surgery.  I reviewed your office note from 11/08/2020.  Her Epworth sleepiness score is 1 out of 24, fatigue severity score is 9 out of 63.  She is not aware of any snoring, husband does not complain about it.  She goes to bed around 9 and rise time is around 5.  She does not nocturia about once per average night, denies recurrent morning headaches or sleep disruption, they do have a TV in the bedroom but she does not watch it at night.  She occasionally is bothered by her husband snoring who snores especially when he is on his back.  She drinks caffeine in the form of sweet tea, not daily, 1 soda per day on average, 1 can.  She does not drink any coffee and no alcohol, she is a non-smoker.  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  She does have a 45-minute commute to work and from work.  She lives in [place].  She has 1 grown daughter who also lives in [place].  She denies any sleepiness at the wheel.  She had no problems with her recent upper endoscopy.  She is not aware of any family history of sleep apnea, she never had a tonsillectomy. She had a recent EGD on 02/26/21.  Her Past Medical History Is Significant For: Past Medical History:  Diagnosis Date   Arthritis     Hypertension    Migraines    once very couple months    Obesity    Primary localized osteoarthritis of left knee 02/01/2019    Her Past Surgical History Is Significant For: Past Surgical History:  Procedure Laterality Date   ABDOMINAL HYSTERECTOMY     PARTIAL KNEE ARTHROPLASTY Left 02/01/2019   Procedure: UNICOMPARTMENTAL LEFT KNEE;  Surgeon: Marchia Bond, MD;  Location: [facility];  Service: Orthopedics;  Laterality: Left;    Her Family History Is Significant For: Family History  Problem Relation Age of Onset   Hypertension Mother    Diabetes Mother    Diabetes Sister    Hypertension Brother    Diabetes Brother    Breast cancer Maternal Aunt    Heart disease Maternal Grandmother    Hypertension Other    Hyperlipidemia Other    Diabetes Other    Stroke Other    Heart disease Other    Colon cancer Neg Hx    Sleep apnea Neg Hx     Her Social History Is Significant For: Social History   Socioeconomic History   Marital status: Married    Spouse name: Not on file   Number of children: Not on file   Years of education: Not on file   Highest education level: Not on file  Occupational History   Not on file  Tobacco Use  Smoking status: Never   Smokeless tobacco: Never  Substance and Sexual Activity   Alcohol use: No   Drug use: No   Sexual activity: Never  Other Topics Concern   Not on file  Social History Narrative   Not on file   Social Determinants of Health   Financial Resource Strain: Not on file  Food Insecurity: Not on file  Transportation Needs: Not on file  Physical Activity: Not on file  Stress: Not on file  Social Connections: Not on file    Her Allergies Are:  No Known Allergies:   Her Current Medications Are:  Outpatient Encounter Medications as of 02/28/2021  Medication Sig   hydrochlorothiazide (HYDRODIURIL) 12.5 MG tablet Take 12.5 mg by mouth 2 (two) times daily.    No facility-administered encounter medications on file as of  02/28/2021.  :   Review of Systems:  Out of a complete 14 point review of systems, all are reviewed and negative with the exception of these symptoms as listed below:   Review of Systems  Neurological:        Pt is here for sleep consult for Bariatric Surgery. Pt denies snoring, fatigue, and headaches in am . Pt states she does have hypertension . Pt states never had sleep study done before .   ESS:1 FSS :9   Objective:  Neurological Exam  Physical Exam Physical Examination:   Vitals:   02/28/21 0827  BP: (!) 140/91  Pulse: 76    General Examination: The patient is a very pleasant 55 y.o. female in no acute distress. She appears well-developed and well-nourished and well groomed.   HEENT: Normocephalic, atraumatic, pupils are equal, round and reactive to light, extraocular tracking is good without limitation to gaze excursion or nystagmus noted.  Corrective eyeglasses in place.  Hearing is grossly intact. Face is symmetric with normal facial animation. Speech is clear with no dysarthria noted. There is no hypophonia. There is no lip, neck/head, jaw or voice tremor. Neck is supple with full range of passive and active motion. There are no carotid bruits on auscultation. Oropharynx exam reveals: No significant mouth dryness, adequate dental hygiene, teeth are slightly malaligned, mild overbite noted.  Tongue protrudes centrally and palate elevates symmetrically.  Mallampati class I, tonsils on the smaller side, around 1+ bilaterally, uvula normal in size, slightly wider tongue base.  Nasal inspection reveals no significant septal deviation, mild inferior turbinate hypertrophy bilaterally.  Neck circumference of 13 and three-quarter inches.   Chest: Clear to auscultation without wheezing, rhonchi or crackles noted.  Heart: S1+S2+0, regular and normal without murmurs, rubs or gallops noted.   Abdomen: Soft, non-tender and non-distended with normal bowel sounds appreciated on  auscultation.  Extremities: There is trace pitting edema in the distal lower extremities bilaterally.   Skin: Warm and dry without trophic changes noted.   Musculoskeletal: exam reveals no obvious joint deformities, tenderness or joint swelling or erythema.   Neurologically:  Mental status: The patient is awake, alert and oriented in all 4 spheres. Her immediate and remote memory, attention, language skills and fund of knowledge are appropriate. There is no evidence of aphasia, agnosia, apraxia or anomia. Speech is clear with normal prosody and enunciation. Thought process is linear. Mood is normal and affect is normal.  Cranial nerves II - XII are as described above under HEENT exam.  Motor exam: Normal bulk, strength and tone is noted. There is no tremor, fine motor skills and coordination: grossly intact.  Cerebellar testing: No  dysmetria or intention tremor. There is no truncal or gait ataxia.  Sensory exam: intact to light touch in the upper and lower extremities.  Gait, station and balance: She stands easily. No veering to one side is noted. No leaning to one side is noted. Posture is age-appropriate and stance is narrow based. Gait shows normal stride length and normal pace. No problems turning are noted.   Assessment and Plan:  In summary, Tamara Malone is a very pleasant 55 y.o.-year old female with an underlying medical history of hypertension, arthritis, migraine headaches, and morbid obesity with a BMI of over 40, who presents for sleep evaluation.  She may be at risk for underlying obstructive sleep apnea.  She is a bariatric surgery candidate.  I explained the importance of ruling out sleep apnea in elective surgery candidates. I had a long chat with the patient about my findings and the diagnosis of OSA, its prognosis and treatment options. We talked about medical treatments, surgical interventions and non-pharmacological approaches. I explained in particular the risks and  ramifications of untreated moderate to severe OSA, especially with respect to developing cardiovascular disease down the Road, including congestive heart failure, difficult to treat hypertension, cardiac arrhythmias, or stroke. Even type 2 diabetes has, in part, been linked to untreated OSA. Symptoms of untreated OSA include daytime sleepiness, memory problems, mood irritability and mood disorder such as depression and anxiety, lack of energy, as well as recurrent headaches, especially morning headaches. We talked about trying to maintain a healthy lifestyle in general, as well as the importance of weight control. We also talked about the importance of good sleep hygiene. I recommended the following at this time: sleep study.  I outlined the difference between a laboratory attended sleep study versus home sleep test.  I explained the sleep test procedure to the patient and also outlined possible surgical and non-surgical treatment options of OSA, including the use of a custom-made dental device (which would require a referral to a specialist dentist or oral surgeon), upper airway surgical options, such as traditional UPPP or a novel less invasive surgical option in the form of Inspire hypoglossal nerve stimulation (which would involve a referral to an ENT surgeon). I also explained the CPAP treatment option to the patient, who indicated that she would be willing to try CPAP or AutoPap if the need arises.  We will pick up our discussion after test results are in.  We will keep her posted as to her results by phone call as well.  We will plan a follow-up in this clinic accordingly.  I answered all her questions today and she was in agreement with the plan.   Thank you very much for allowing me to participate in the care of this nice patient. If I can be of any further assistance to you please do not hesitate to call me at [phone].  Sincerely,   Star Age, MD, PhD

## 2021-02-28 NOTE — Patient Instructions (Signed)

## 2021-03-06 ENCOUNTER — Telehealth: Payer: Self-pay

## 2021-03-06 NOTE — Telephone Encounter (Signed)
LVM with husband for patient to call back to schedule sleep study

## 2021-03-13 ENCOUNTER — Ambulatory Visit (INDEPENDENT_AMBULATORY_CARE_PROVIDER_SITE_OTHER): Payer: 59 | Admitting: Neurology

## 2021-03-13 DIAGNOSIS — Z9189 Other specified personal risk factors, not elsewhere classified: Secondary | ICD-10-CM

## 2021-03-13 DIAGNOSIS — R5383 Other fatigue: Secondary | ICD-10-CM

## 2021-03-13 DIAGNOSIS — G4730 Sleep apnea, unspecified: Secondary | ICD-10-CM

## 2021-03-13 DIAGNOSIS — J343 Hypertrophy of nasal turbinates: Secondary | ICD-10-CM

## 2021-03-13 DIAGNOSIS — G4733 Obstructive sleep apnea (adult) (pediatric): Secondary | ICD-10-CM

## 2021-03-13 DIAGNOSIS — R351 Nocturia: Secondary | ICD-10-CM

## 2021-03-18 NOTE — Progress Notes (Signed)
See procedure note.

## 2021-03-19 NOTE — Procedures (Signed)
   [facility]  HOME SLEEP TEST (Watch PAT) REPORT  STUDY DATE: 03/13/2021  DOB: 07/19/1965  MRN: [id number]  ORDERING CLINICIAN: Star Age, MD, PhD   REFERRING CLINICIAN: Dr. Louanna Raw  CLINICAL INFORMATION/HISTORY: 55 year old right-handed woman with an underlying medical history of hypertension, arthritis, migraine headaches, and morbid obesity with a BMI of over 40, who reports occasional daytime tiredness after work.  She is being evaluated for bariatric surgery.  Epworth sleepiness score: 1/24.  BMI: 44 kg/m  FINDINGS:   Sleep Summary:   Total Recording Time (hours, min): 8 hours, 55 minutes  Total Sleep Time (hours, min):  7 hours, 54 minutes   Percent REM (%):    15.6%   Respiratory Indices:   Calculated pAHI (per hour):  4.7/hour         REM pAHI:    6.3/hour       NREM pAHI: 4.5/hour  Oxygen Saturation Statistics:    Oxygen Saturation (%) Mean: 94%   Minimum oxygen saturation (%):                 89%   O2 Saturation Range (%): 89-99%    O2 Saturation (minutes) <=88%: 0 min  Pulse Rate Statistics:   Pulse Mean (bpm):    80/min    Pulse Range (62-108/min)   IMPRESSION: Sleep disordered breathing  RECOMMENDATION:  This home sleep test demonstrates borderline sleep disordered breathing with a total AHI of 4.7/h, 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  Treatment with positive airway pressure such as a CPAP or AutoPap machine is not necessary. Other causes of the patient's symptoms, including circadian rhythm disturbances, an underlying mood disorder, medication effect and/or an underlying medical problem cannot be ruled out based on this test. Clinical correlation is recommended. The patient should be cautioned not to drive, work at heights, or operate dangerous or heavy equipment when tired or sleepy. Review and reiteration of good sleep hygiene measures should be pursued with any patient. The patient can follow  up with her referring provider, who will be notified of the test results.   I certify that I have reviewed the raw data recording prior to the issuance of this report in accordance with the standards of the American Academy of Sleep Medicine (AASM).   INTERPRETING PHYSICIAN:   Star Age, MD, PhD  Board Certified in Neurology and Sleep Medicine  [facility] [address], [place], [place] [address] [phone]

## 2021-03-20 ENCOUNTER — Telehealth: Payer: Self-pay | Admitting: Neurology

## 2021-03-20 NOTE — Telephone Encounter (Signed)
Called patient to discuss sleep study results. No answer at this time. LVM for the patient to call back.   

## 2021-03-20 NOTE — Telephone Encounter (Signed)
-----   Message from Star Age, MD sent at 03/19/2021  5:31 PM EDT ----- Patient referred by Dr. Thermon Leyland with gen surg, seen by me on 02/28/2021, HST on 03/13/2021.   Please call and notify the patient that the recent home sleep test did not show any significant obstructive sleep apnea, she likely has borderline sleep apnea, especially during dream sleep, but total apnea and shallow breathing (which we call hypopneas) score was less than 5/h. She had minimal desaturations, does not have to be treated with a CPAP or AutoPap machine.  Please advise patient to follow-up with her primary care physician and specialists as scheduled.  We can send a copy of the report to her referring physician, and PCP if she would like.  Thanks,  Star Age, MD, PhD [facility])

## 2021-03-21 NOTE — Telephone Encounter (Addendum)
Pt returned our call. We discussed the sleep study results as noted below by Dr Rexene Alberts.  Patient aware her her recent home sleep test did not show any significant obstructive sleep apnea.  She likely has borderline sleep apnea especially during sleep but her AHI was less than 5.  She had minimal desaturations, she does not need to be treated with a CPAP or AutoPap machine.  Patient verbalized understanding and she will follow with her primary care physician and specialist as scheduled.  Also discussed sending reports over to her referring MD and primary care and she would like both of them updated.  She did not have any questions and she verbalized appreciation for the call.  Results sent to Dr. Osborne Casco and Dr. Thermon Leyland.

## 2021-04-17 ENCOUNTER — Other Ambulatory Visit: Payer: Self-pay | Admitting: Internal Medicine

## 2021-04-17 DIAGNOSIS — Z1231 Encounter for screening mammogram for malignant neoplasm of breast: Secondary | ICD-10-CM

## 2021-06-05 ENCOUNTER — Ambulatory Visit: Payer: Self-pay | Admitting: Surgery

## 2021-06-05 DIAGNOSIS — Z01818 Encounter for other preprocedural examination: Secondary | ICD-10-CM

## 2021-06-05 NOTE — Progress Notes (Signed)
Sent message, via epic in basket, requesting orders in epic from surgeon.  

## 2021-06-10 ENCOUNTER — Other Ambulatory Visit: Payer: Self-pay

## 2021-06-10 ENCOUNTER — Encounter: Payer: 59 | Attending: Surgery | Admitting: Skilled Nursing Facility1

## 2021-06-10 DIAGNOSIS — E669 Obesity, unspecified: Secondary | ICD-10-CM | POA: Insufficient documentation

## 2021-06-10 NOTE — Progress Notes (Signed)
Pre-Operative Nutrition Class:    Patient was seen on 06/10/2021 for Pre-Operative Bariatric Surgery Education at the [facility].    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 Start weight at [facility]: 246.4 Weight today: 245 pounds  Samples given per MNT protocol. Patient educated on appropriate usage: Ensure max exp: July 17, 2021 Ensure max lot: [id number]  Chewable bariatric advantage: advanced multi EA exp: 08/23 Chewable bariatric advantage: advanced multi EA lot: [id number]  Bariatric advantage calcium citrate exp: 02/23 Bariatric advantage calcium citrate lot: [id number]   The following the learning objectives were met by the patient during this course: Identify Pre-Op Dietary Goals and will begin 2 weeks pre-operatively Identify appropriate sources of fluids and proteins  State protein recommendations and appropriate sources pre and post-operatively Identify Post-Operative Dietary Goals and will follow for 2 weeks post-operatively Identify appropriate multivitamin and calcium sources Describe the need for physical activity post-operatively and will follow MD recommendations State when to call healthcare provider regarding medication questions or post-operative complications When having a diagnosis of diabetes understanding hypoglycemia symptoms and the inclusion of 1 complex carbohydrate per meal  Handouts given during class include: Pre-Op Bariatric Surgery Diet Handout Protein Shake Handout Post-Op Bariatric Surgery Nutrition Handout BELT Program Information Flyer Support Group Information Flyer [facility] Bariatric Supplements Price List  Follow-Up Plan: Patient will follow-up at [facility] 2 weeks post operatively for diet advancement per MD.

## 2021-06-14 NOTE — Patient Instructions (Addendum)
DUE TO COVID-19 ONLY ONE VISITOR IS ALLOWED TO COME WITH YOU AND STAY IN THE WAITING ROOM ONLY DURING PRE OP AND PROCEDURE.   **NO VISITORS ARE ALLOWED IN THE SHORT STAY AREA OR RECOVERY ROOM!!**  IF YOU WILL BE ADMITTED INTO THE HOSPITAL YOU ARE ALLOWED ONLY TWO SUPPORT PEOPLE DURING VISITATION HOURS ONLY (7 AM -8PM)   The support person(s) must pass our screening, gel in and out, and wear a mask at all times, including in the patients room. Patients must also wear a mask when staff or their support person are in the room. Visitors GUEST BADGE MUST BE WORN VISIBLY  One adult visitor may remain with you overnight and MUST be in the room by 8 P.M.  No visitors under the age of 11. Any visitor under the age of 56 must be accompanied by an adult.    COVID SWAB TESTING MUST BE COMPLETED ON: 06/20/21 @ 8:15 AM    Site: [facility] [place] Enter: Main Entrance have a seat in the waiting area to the right of main entrance (DO NOT [place]!!!!!) Dial: [phone] to alert staff you have arrived  You are not required to quarantine, however you are required to wear a well-fitted mask when you are out and around people not in your household.  Hand Hygiene often Do NOT share personal items Notify your provider if you are in close contact with someone who has COVID or you develop fever 100.4 or greater, new onset of sneezing, cough, sore throat, shortness of breath or body aches.  [place], [address], must go inside of the hospital, NOT A DRIVE THRU!  (Must self quarantine after testing. Follow instructions on handout.)       Your procedure is scheduled on: 06/24/21   Report to [facility] Main Entrance    Report to short stay at: 5:15 AM   Call this number if you have problems the morning of surgery [phone]   May have liquids until : 4:40 AM   day of surgery  CLEAR  LIQUID DIET  Foods Allowed                                                                     Foods Excluded  Water, Black Coffee and tea, regular and decaf                             liquids that you cannot  Plain Jell-O in any flavor  (No red)                                           see through such as: Fruit ices (not with fruit pulp)                                     milk, soups, orange juice  Iced Popsicles (No red)                                    All solid food                                   Apple juices Sports drinks like Gatorade (No red) Lightly seasoned clear broth or consume(fat free) Sugar  Sample Menu Breakfast                                Lunch                                     Supper Cranberry juice                    Beef broth                            Chicken broth Jell-O                                     Grape juice                           Apple juice Coffee or tea                        Jell-O                                      Popsicle                                                Coffee or tea                        Coffee or tea    MORNING OF SURGERY DRINK:   DRINK 1 G2 drink BEFORE YOU LEAVE HOME, DRINK ALL OF THE  G2 DRINK AT ONE TIME (4:30 AM).   NO SOLID FOOD AFTER 600 PM THE NIGHT BEFORE YOUR SURGERY. YOU MAY DRINK CLEAR FLUIDS. THE G2 DRINK YOU DRINK BEFORE YOU LEAVE HOME WILL BE THE LAST FLUIDS YOU DRINK BEFORE SURGERY.  PAIN IS EXPECTED AFTER SURGERY AND WILL NOT BE COMPLETELY ELIMINATED. AMBULATION AND TYLENOL WILL HELP REDUCE INCISIONAL AND GAS PAIN. MOVEMENT IS KEY!  YOU ARE EXPECTED TO BE OUT OF BED WITHIN 4 HOURS OF ADMISSION TO YOUR PATIENT ROOM.  SITTING IN THE RECLINER THROUGHOUT THE DAY IS IMPORTANT FOR DRINKING FLUIDS AND MOVING GAS THROUGHOUT THE GI TRACT.  COMPRESSION STOCKINGS SHOULD BE WORN [place] UNLESS YOU ARE WALKING.   INCENTIVE SPIROMETER SHOULD BE USED EVERY HOUR WHILE AWAKE TO  DECREASE POST-OPERATIVE COMPLICATIONS SUCH AS PNEUMONIA.  WHEN DISCHARGED HOME, IT IS IMPORTANT TO CONTINUE TO WALK EVERY HOUR AND  USE THE INCENTIVE SPIROMETER EVERY HOUR.       The day of surgery:  Drink ONE (1) Pre-Surgery Clear Ensure or G2 at AM the morning of surgery. Drink in one sitting. Do not sip.  This drink was given to you during your hospital  pre-op appointment visit. Nothing else to drink after completing the  Pre-Surgery Clear Ensure or G2.          If you have questions, please contact your surgeons office.  FOLLOW BOWEL PREP INSTRUCTIONS YOU RECEIVED FROM YOUR SURGEON'S OFFICE!!!     Oral Hygiene is also important to reduce your risk of infection.                                    Remember - BRUSH YOUR TEETH THE MORNING OF SURGERY WITH YOUR REGULAR TOOTHPASTE   Do NOT smoke after Midnight   Take these medicines the morning of surgery with A SIP OF WATER: N/A  DO NOT TAKE ANY ORAL DIABETIC MEDICATIONS DAY OF YOUR SURGERY                              You may not have any metal on your body including hair pins, jewelry, and body piercing             Do not wear make-up, lotions, powders, perfumes/cologne, or deodorant  Do not wear nail polish including gel and S&S, artificial/acrylic nails, or any other type of covering on natural nails including finger and toenails. If you have artificial nails, gel coating, etc. that needs to be removed by a nail salon please have this removed prior to surgery or surgery may need to be canceled/ delayed if the surgeon/ anesthesia feels like they are unable to be safely monitored.   Do not shave  48 hours prior to surgery.    Do not bring valuables to the hospital. [place].   Contacts, dentures or bridgework may not be worn into surgery.   Bring small overnight bag day of surgery.    Patients discharged on the day of surgery will not be allowed to drive home.  Someone needs  to stay with you for the first 24 hours after anesthesia.   Special Instructions: Bring a copy of your healthcare power of attorney and living will documents         the day of surgery if you haven't scanned them before.              Please read over the following fact sheets you were given: IF YOU HAVE QUESTIONS ABOUT YOUR PRE-OP INSTRUCTIONS PLEASE CALL [phone]     [facility] - Preparing for Surgery Before surgery, you can play an important role.  Because skin is not sterile, your skin needs to be as free of germs as possible.  You can reduce the number of germs on your skin by washing with CHG (chlorahexidine gluconate) soap before surgery.  CHG is an antiseptic cleaner which kills germs and bonds with the skin to continue killing germs even after washing. Please DO NOT use if you have an allergy to CHG or antibacterial soaps.  If your skin becomes reddened/irritated stop using the CHG and inform your nurse when you arrive at Short  Stay. Do not shave (including legs and underarms) for at least 48 hours prior to the first CHG shower.  You may shave your face/neck. Please follow these instructions carefully:  1.  Shower with CHG Soap the night before surgery and the  morning of Surgery.  2.  If you choose to wash your hair, wash your hair first as usual with your  normal  shampoo.  3.  After you shampoo, rinse your hair and body thoroughly to remove the  shampoo.                           4.  Use CHG as you would any other liquid soap.  You can apply chg directly  to the skin and wash                       Gently with a scrungie or clean washcloth.  5.  Apply the CHG Soap to your body ONLY FROM THE NECK DOWN.   Do not use on face/ open                           Wound or open sores. Avoid contact with eyes, ears mouth and genitals (private parts).                       Wash face,  Genitals (private parts) with your normal soap.             6.  Wash thoroughly, paying special attention to the  area where your surgery  will be performed.  7.  Thoroughly rinse your body with warm water from the neck down.  8.  DO NOT shower/wash with your normal soap after using and rinsing off  the CHG Soap.                9.  Pat yourself dry with a clean towel.            10.  Wear clean pajamas.            11.  Place clean sheets on your bed the night of your first shower and do not  sleep with pets. Day of Surgery : Do not apply any lotions/deodorants the morning of surgery.  Please wear clean clothes to the hospital/surgery center.  FAILURE TO FOLLOW THESE INSTRUCTIONS MAY RESULT IN THE CANCELLATION OF YOUR SURGERY PATIENT SIGNATURE_________________________________  NURSE SIGNATURE__________________________________  ________________________________________________________________________

## 2021-06-17 ENCOUNTER — Other Ambulatory Visit: Payer: Self-pay

## 2021-06-17 ENCOUNTER — Other Ambulatory Visit (HOSPITAL_COMMUNITY): Payer: Self-pay

## 2021-06-17 ENCOUNTER — Encounter (HOSPITAL_COMMUNITY): Payer: Self-pay

## 2021-06-17 ENCOUNTER — Encounter (HOSPITAL_COMMUNITY)
Admission: RE | Admit: 2021-06-17 | Discharge: 2021-06-17 | Disposition: A | Discharge status: Home / Self Care | Payer: 59 | Source: Ambulatory Visit | Attending: Surgery | Admitting: Surgery

## 2021-06-17 DIAGNOSIS — Z01812 Encounter for preprocedural laboratory examination: Secondary | ICD-10-CM | POA: Insufficient documentation

## 2021-06-17 DIAGNOSIS — Z01818 Encounter for other preprocedural examination: Secondary | ICD-10-CM

## 2021-06-17 LAB — CBC WITH DIFFERENTIAL/PLATELET
Abs Immature Granulocytes: 0.01 10*3/uL (ref 0.00–0.07)
Basophils Absolute: 0 10*3/uL (ref 0.0–0.1)
Basophils Relative: 1 %
Eosinophils Absolute: 0 10*3/uL (ref 0.0–0.5)
Eosinophils Relative: 1 %
HCT: 37.1 % (ref 36.0–46.0)
Hemoglobin: 12 g/dL (ref 12.0–15.0)
Immature Granulocytes: 0 %
Lymphocytes Relative: 40 %
Lymphs Abs: 1.7 10*3/uL (ref 0.7–4.0)
MCH: 27.4 pg (ref 26.0–34.0)
MCHC: 32.3 g/dL (ref 30.0–36.0)
MCV: 84.7 fL (ref 80.0–100.0)
Monocytes Absolute: 0.3 10*3/uL (ref 0.1–1.0)
Monocytes Relative: 7 %
Neutro Abs: 2.2 10*3/uL (ref 1.7–7.7)
Neutrophils Relative %: 51 %
Platelets: 351 10*3/uL (ref 150–400)
RBC: 4.38 MIL/uL (ref 3.87–5.11)
RDW: 16.5 % — ABNORMAL HIGH (ref 11.5–15.5)
WBC: 4.3 10*3/uL (ref 4.0–10.5)
nRBC: 0 % (ref 0.0–0.2)

## 2021-06-17 LAB — COMPREHENSIVE METABOLIC PANEL
ALT: 25 U/L (ref 0–44)
AST: 23 U/L (ref 15–41)
Albumin: 3.9 g/dL (ref 3.5–5.0)
Alkaline Phosphatase: 73 U/L (ref 38–126)
Anion gap: 12 (ref 5–15)
BUN: 27 mg/dL — ABNORMAL HIGH (ref 6–20)
CO2: 26 mmol/L (ref 22–32)
Calcium: 9.5 mg/dL (ref 8.9–10.3)
Chloride: 101 mmol/L (ref 98–111)
Creatinine, Ser: 0.78 mg/dL (ref 0.44–1.00)
GFR, Estimated: >60 mL/min (ref >60)
Glucose, Bld: 86 mg/dL (ref 70–99)
Potassium: 3.8 mmol/L (ref 3.5–5.1)
Sodium: 139 mmol/L (ref 135–145)
Total Bilirubin: 0.1 mg/dL — ABNORMAL LOW (ref 0.3–1.2)
Total Protein: 7.5 g/dL (ref 6.5–8.1)

## 2021-06-17 NOTE — Progress Notes (Signed)
COVID Vaccine Completed: Yes Date COVID Vaccine completed: 02/21/21 x 4 COVID vaccine manufacturer:    Moderna    COVID Test: 06/20/21 PCP - Dr. Domenick Gong Cardiologist -   Chest x-ray - 11/16/20 EKG - 11/16/20 Stress Test -  ECHO -  Cardiac Cath -  Pacemaker/ICD device last checked:  Sleep Study - Yes CPAP - NO  Fasting Blood Sugar -  Checks Blood Sugar _____ times a day  Blood Thinner Instructions: Aspirin Instructions: Last Dose:  Anesthesia review: Hx: HTN,OSA (NO CPAP)  Patient denies shortness of breath, fever, cough and chest pain at PAT appointment   Patient verbalized understanding of instructions that were given to them at the PAT appointment. Patient was also instructed that they will need to review over the PAT instructions again at home before surgery.

## 2021-06-20 ENCOUNTER — Other Ambulatory Visit: Payer: Self-pay

## 2021-06-20 ENCOUNTER — Encounter (HOSPITAL_COMMUNITY)
Admission: RE | Admit: 2021-06-20 | Discharge: 2021-06-20 | Disposition: A | Discharge status: Home / Self Care | Payer: 59 | Source: Ambulatory Visit | Attending: Surgery | Admitting: Surgery

## 2021-06-20 DIAGNOSIS — Z20822 Contact with and (suspected) exposure to covid-19: Secondary | ICD-10-CM | POA: Insufficient documentation

## 2021-06-20 DIAGNOSIS — Z01812 Encounter for preprocedural laboratory examination: Secondary | ICD-10-CM | POA: Insufficient documentation

## 2021-06-20 DIAGNOSIS — Z01818 Encounter for other preprocedural examination: Secondary | ICD-10-CM

## 2021-06-20 LAB — SARS CORONAVIRUS 2 (TAT 6-24 HRS): SARS Coronavirus 2: NEGATIVE

## 2021-06-24 ENCOUNTER — Inpatient Hospital Stay (HOSPITAL_COMMUNITY): Payer: 59 | Admitting: Certified Registered"

## 2021-06-24 ENCOUNTER — Encounter (HOSPITAL_COMMUNITY)
Admission: RE | Disposition: A | Discharge status: Home / Self Care | Payer: Self-pay | Source: Home / Self Care | Attending: Surgery

## 2021-06-24 ENCOUNTER — Other Ambulatory Visit: Payer: Self-pay

## 2021-06-24 ENCOUNTER — Encounter (HOSPITAL_COMMUNITY): Payer: Self-pay | Admitting: Surgery

## 2021-06-24 ENCOUNTER — Inpatient Hospital Stay (HOSPITAL_COMMUNITY)
Admission: RE | Admit: 2021-06-24 | Discharge: 2021-06-25 | DRG: 621 | Disposition: A | Discharge status: Home / Self Care | Payer: 59 | Attending: Surgery | Admitting: Surgery

## 2021-06-24 ENCOUNTER — Other Ambulatory Visit (HOSPITAL_COMMUNITY): Payer: Self-pay

## 2021-06-24 DIAGNOSIS — Z96652 Presence of left artificial knee joint: Secondary | ICD-10-CM | POA: Diagnosis present

## 2021-06-24 DIAGNOSIS — K449 Diaphragmatic hernia without obstruction or gangrene: Secondary | ICD-10-CM | POA: Diagnosis present

## 2021-06-24 DIAGNOSIS — Z79899 Other long term (current) drug therapy: Secondary | ICD-10-CM

## 2021-06-24 DIAGNOSIS — Z90710 Acquired absence of both cervix and uterus: Secondary | ICD-10-CM | POA: Diagnosis not present

## 2021-06-24 DIAGNOSIS — I10 Essential (primary) hypertension: Secondary | ICD-10-CM | POA: Diagnosis present

## 2021-06-24 DIAGNOSIS — Z6841 Body Mass Index (BMI) 40.0 and over, adult: Secondary | ICD-10-CM | POA: Diagnosis not present

## 2021-06-24 DIAGNOSIS — Z8249 Family history of ischemic heart disease and other diseases of the circulatory system: Secondary | ICD-10-CM | POA: Diagnosis not present

## 2021-06-24 DIAGNOSIS — M1990 Unspecified osteoarthritis, unspecified site: Secondary | ICD-10-CM | POA: Diagnosis present

## 2021-06-24 DIAGNOSIS — Z01818 Encounter for other preprocedural examination: Secondary | ICD-10-CM

## 2021-06-24 HISTORY — PX: HIATAL HERNIA REPAIR: SHX195

## 2021-06-24 LAB — CREATININE, SERUM
Creatinine, Ser: 0.84 mg/dL (ref 0.44–1.00)
GFR, Estimated: >60 mL/min (ref >60)

## 2021-06-24 LAB — CBC
HCT: 37.4 % (ref 36.0–46.0)
Hemoglobin: 12 g/dL (ref 12.0–15.0)
MCH: 27.6 pg (ref 26.0–34.0)
MCHC: 32.1 g/dL (ref 30.0–36.0)
MCV: 86.2 fL (ref 80.0–100.0)
Platelets: 353 10*3/uL (ref 150–400)
RBC: 4.34 MIL/uL (ref 3.87–5.11)
RDW: 16.4 % — ABNORMAL HIGH (ref 11.5–15.5)
WBC: 10.4 10*3/uL (ref 4.0–10.5)
nRBC: 0 % (ref 0.0–0.2)

## 2021-06-24 LAB — HEMOGLOBIN AND HEMATOCRIT, BLOOD
HCT: 37.4 % (ref 36.0–46.0)
Hemoglobin: 11.9 g/dL — ABNORMAL LOW (ref 12.0–15.0)

## 2021-06-24 LAB — TYPE AND SCREEN
ABO/RH(D): O POS
Antibody Screen: NEGATIVE

## 2021-06-24 LAB — ABO/RH: ABO/RH(D): O POS

## 2021-06-24 SURGERY — CREATION, GASTRIC BYPASS, ROUX-EN-Y, ROBOT-ASSISTED
Anesthesia: General | Site: Abdomen

## 2021-06-24 MED ORDER — PROPOFOL 10 MG/ML IV BOLUS
INTRAVENOUS | Status: AC
  Filled 2021-06-24: qty 20

## 2021-06-24 MED ORDER — PANTOPRAZOLE SODIUM 40 MG PO TBEC
40.0000 mg | DELAYED_RELEASE_TABLET | Freq: Every day | ORAL | 0 refills | Status: DC
  Filled 2021-06-24: qty 90, 90d supply, fill #0

## 2021-06-24 MED ORDER — PROPOFOL 10 MG/ML IV BOLUS
INTRAVENOUS | Status: DC | PRN
  Administered 2021-06-24: 160 mg via INTRAVENOUS

## 2021-06-24 MED ORDER — SUGAMMADEX SODIUM 500 MG/5ML IV SOLN
INTRAVENOUS | Status: AC
  Filled 2021-06-24: qty 5

## 2021-06-24 MED ORDER — ENSURE MAX PROTEIN PO LIQD
2.0000 [oz_av] | ORAL | Status: DC
  Administered 2021-06-24 – 2021-06-25 (×4): 2 [oz_av] via ORAL

## 2021-06-24 MED ORDER — HYDROMORPHONE HCL 1 MG/ML IJ SOLN
INTRAMUSCULAR | Status: AC
  Administered 2021-06-24: 0.5 mg via INTRAVENOUS
  Filled 2021-06-24: qty 2

## 2021-06-24 MED ORDER — BUPIVACAINE LIPOSOME 1.3 % IJ SUSP: 20.0000 mL | Freq: Once | INTRAMUSCULAR | Status: DC

## 2021-06-24 MED ORDER — CHLORHEXIDINE GLUCONATE CLOTH 2 % EX PADS: 6.0000 | MEDICATED_PAD | Freq: Once | CUTANEOUS | Status: DC

## 2021-06-24 MED ORDER — ACETAMINOPHEN 500 MG PO TABS: 1000.0000 mg | ORAL_TABLET | Freq: Three times a day (TID) | ORAL | 0 refills | Status: AC

## 2021-06-24 MED ORDER — LIDOCAINE 2% (20 MG/ML) 5 ML SYRINGE
INTRAMUSCULAR | Status: DC | PRN
  Administered 2021-06-24: 40 mg via INTRAVENOUS

## 2021-06-24 MED ORDER — MIDAZOLAM HCL 2 MG/2ML IJ SOLN
INTRAMUSCULAR | Status: AC
  Filled 2021-06-24: qty 2

## 2021-06-24 MED ORDER — ACETAMINOPHEN 500 MG PO TABS
1000.0000 mg | ORAL_TABLET | ORAL | Status: AC
  Administered 2021-06-24: 1000 mg via ORAL
  Filled 2021-06-24: qty 2

## 2021-06-24 MED ORDER — EPHEDRINE SULFATE-NACL 50-0.9 MG/10ML-% IV SOSY
PREFILLED_SYRINGE | INTRAVENOUS | Status: DC | PRN
  Administered 2021-06-24 (×2): 5 mg via INTRAVENOUS

## 2021-06-24 MED ORDER — ROCURONIUM BROMIDE 10 MG/ML (PF) SYRINGE
PREFILLED_SYRINGE | INTRAVENOUS | Status: AC
  Filled 2021-06-24: qty 10

## 2021-06-24 MED ORDER — DEXAMETHASONE SODIUM PHOSPHATE 10 MG/ML IJ SOLN
INTRAMUSCULAR | Status: AC
  Filled 2021-06-24: qty 1

## 2021-06-24 MED ORDER — 0.9 % SODIUM CHLORIDE (POUR BTL) OPTIME
TOPICAL | Status: DC | PRN
  Administered 2021-06-24: 1000 mL

## 2021-06-24 MED ORDER — FENTANYL CITRATE (PF) 250 MCG/5ML IJ SOLN
INTRAMUSCULAR | Status: DC | PRN
  Administered 2021-06-24: 100 ug via INTRAVENOUS

## 2021-06-24 MED ORDER — ORAL CARE MOUTH RINSE
15.0000 mL | Freq: Once | OROMUCOSAL | Status: AC
  Administered 2021-06-24: 15 mL via OROMUCOSAL

## 2021-06-24 MED ORDER — MORPHINE SULFATE (PF) 2 MG/ML IV SOLN: 1.0000 mg | INTRAVENOUS | Status: DC | PRN

## 2021-06-24 MED ORDER — MEPERIDINE HCL 50 MG/ML IJ SOLN: 6.2500 mg | INTRAMUSCULAR | Status: DC | PRN

## 2021-06-24 MED ORDER — ENOXAPARIN SODIUM 30 MG/0.3ML IJ SOSY
30.0000 mg | PREFILLED_SYRINGE | Freq: Two times a day (BID) | INTRAMUSCULAR | Status: DC
  Administered 2021-06-24 – 2021-06-25 (×2): 30 mg via SUBCUTANEOUS
  Filled 2021-06-24 (×2): qty 0.3

## 2021-06-24 MED ORDER — OXYCODONE HCL 5 MG PO TABS: 5.0000 mg | ORAL_TABLET | Freq: Once | ORAL | Status: DC | PRN

## 2021-06-24 MED ORDER — ACETAMINOPHEN 500 MG PO TABS
1000.0000 mg | ORAL_TABLET | Freq: Three times a day (TID) | ORAL | Status: DC
  Administered 2021-06-24 – 2021-06-25 (×3): 1000 mg via ORAL
  Filled 2021-06-24 (×3): qty 2

## 2021-06-24 MED ORDER — APREPITANT 40 MG PO CAPS
40.0000 mg | ORAL_CAPSULE | ORAL | Status: AC
  Administered 2021-06-24: 40 mg via ORAL
  Filled 2021-06-24: qty 1

## 2021-06-24 MED ORDER — CHLORHEXIDINE GLUCONATE 0.12 % MT SOLN: 15.0000 mL | Freq: Once | OROMUCOSAL | Status: AC

## 2021-06-24 MED ORDER — LACTATED RINGERS IV SOLN: INTRAVENOUS | Status: DC

## 2021-06-24 MED ORDER — AMISULPRIDE (ANTIEMETIC) 5 MG/2ML IV SOLN
INTRAVENOUS | Status: AC
  Filled 2021-06-24: qty 4

## 2021-06-24 MED ORDER — LACTATED RINGERS IR SOLN
Status: DC | PRN
  Administered 2021-06-24: 1000 mL

## 2021-06-24 MED ORDER — OXYCODONE HCL 5 MG/5ML PO SOLN: 5.0000 mg | Freq: Four times a day (QID) | ORAL | Status: DC | PRN

## 2021-06-24 MED ORDER — OXYCODONE HCL 5 MG/5ML PO SOLN: 5.0000 mg | Freq: Once | ORAL | Status: DC | PRN

## 2021-06-24 MED ORDER — BUPIVACAINE LIPOSOME 1.3 % IJ SUSP
INTRAMUSCULAR | Status: DC | PRN
  Administered 2021-06-24: 20 mL

## 2021-06-24 MED ORDER — MIDAZOLAM HCL 2 MG/2ML IJ SOLN
INTRAMUSCULAR | Status: DC | PRN
  Administered 2021-06-24: 2 mg via INTRAVENOUS

## 2021-06-24 MED ORDER — PROMETHAZINE HCL 25 MG/ML IJ SOLN: 6.2500 mg | INTRAMUSCULAR | Status: DC | PRN

## 2021-06-24 MED ORDER — DEXAMETHASONE SODIUM PHOSPHATE 10 MG/ML IJ SOLN
INTRAMUSCULAR | Status: DC | PRN
  Administered 2021-06-24: 4 mg via INTRAVENOUS

## 2021-06-24 MED ORDER — PANTOPRAZOLE SODIUM 40 MG IV SOLR
40.0000 mg | Freq: Every day | INTRAVENOUS | Status: DC
  Administered 2021-06-24: 40 mg via INTRAVENOUS
  Filled 2021-06-24: qty 40

## 2021-06-24 MED ORDER — PHENYLEPHRINE HCL-NACL 20-0.9 MG/250ML-% IV SOLN
INTRAVENOUS | Status: DC | PRN
  Administered 2021-06-24: 20 ug/min via INTRAVENOUS

## 2021-06-24 MED ORDER — ONDANSETRON 4 MG PO TBDP
4.0000 mg | ORAL_TABLET | Freq: Four times a day (QID) | ORAL | 0 refills | Status: DC | PRN
  Filled 2021-06-24: qty 20, 5d supply, fill #0

## 2021-06-24 MED ORDER — ENOXAPARIN SODIUM 40 MG/0.4ML IJ SOSY
40.0000 mg | PREFILLED_SYRINGE | INTRAMUSCULAR | Status: AC
  Administered 2021-06-24: 40 mg via SUBCUTANEOUS
  Filled 2021-06-24: qty 0.4

## 2021-06-24 MED ORDER — SODIUM CHLORIDE 0.9 % IV SOLN
2.0000 g | INTRAVENOUS | Status: AC
  Administered 2021-06-24: 2 g via INTRAVENOUS
  Filled 2021-06-24: qty 2

## 2021-06-24 MED ORDER — ONDANSETRON HCL 4 MG/2ML IJ SOLN
INTRAMUSCULAR | Status: AC
  Filled 2021-06-24: qty 2

## 2021-06-24 MED ORDER — SIMETHICONE 80 MG PO CHEW
80.0000 mg | CHEWABLE_TABLET | Freq: Four times a day (QID) | ORAL | Status: DC | PRN
  Administered 2021-06-24: 80 mg via ORAL
  Filled 2021-06-24: qty 1

## 2021-06-24 MED ORDER — FENTANYL CITRATE (PF) 100 MCG/2ML IJ SOLN
INTRAMUSCULAR | Status: AC
  Filled 2021-06-24: qty 2

## 2021-06-24 MED ORDER — ONDANSETRON HCL 4 MG/2ML IJ SOLN
INTRAMUSCULAR | Status: DC | PRN
  Administered 2021-06-24: 4 mg via INTRAVENOUS

## 2021-06-24 MED ORDER — BUPIVACAINE-EPINEPHRINE 0.25% -1:200000 IJ SOLN
INTRAMUSCULAR | Status: DC | PRN
  Administered 2021-06-24: 30 mL

## 2021-06-24 MED ORDER — HYDROMORPHONE HCL 1 MG/ML IJ SOLN
0.2500 mg | INTRAMUSCULAR | Status: DC | PRN
  Administered 2021-06-24: 0.5 mg via INTRAVENOUS

## 2021-06-24 MED ORDER — GABAPENTIN 100 MG PO CAPS
200.0000 mg | ORAL_CAPSULE | Freq: Two times a day (BID) | ORAL | 0 refills | Status: DC
  Filled 2021-06-24: qty 20, 5d supply, fill #0

## 2021-06-24 MED ORDER — PHENYLEPHRINE 40 MCG/ML (10ML) SYRINGE FOR IV PUSH (FOR BLOOD PRESSURE SUPPORT)
PREFILLED_SYRINGE | INTRAVENOUS | Status: DC | PRN
  Administered 2021-06-24: 40 ug via INTRAVENOUS
  Administered 2021-06-24: 80 ug via INTRAVENOUS
  Administered 2021-06-24: 40 ug via INTRAVENOUS

## 2021-06-24 MED ORDER — ACETAMINOPHEN 160 MG/5ML PO SOLN
1000.0000 mg | Freq: Three times a day (TID) | ORAL | Status: DC
  Administered 2021-06-24: 1000 mg via ORAL
  Filled 2021-06-24: qty 40.6

## 2021-06-24 MED ORDER — SUGAMMADEX SODIUM 500 MG/5ML IV SOLN
INTRAVENOUS | Status: DC | PRN
  Administered 2021-06-24: 300 mg via INTRAVENOUS

## 2021-06-24 MED ORDER — AMISULPRIDE (ANTIEMETIC) 5 MG/2ML IV SOLN: 10.0000 mg | Freq: Once | INTRAVENOUS | Status: DC | PRN

## 2021-06-24 MED ORDER — ONDANSETRON HCL 4 MG/2ML IJ SOLN: 4.0000 mg | INTRAMUSCULAR | Status: DC | PRN

## 2021-06-24 MED ORDER — BUPIVACAINE-EPINEPHRINE (PF) 0.25% -1:200000 IJ SOLN
INTRAMUSCULAR | Status: AC
  Filled 2021-06-24: qty 30

## 2021-06-24 MED ORDER — ROCURONIUM BROMIDE 10 MG/ML (PF) SYRINGE
PREFILLED_SYRINGE | INTRAVENOUS | Status: DC | PRN
  Administered 2021-06-24: 20 mg via INTRAVENOUS
  Administered 2021-06-24: 5 mg via INTRAVENOUS
  Administered 2021-06-24: 100 mg via INTRAVENOUS
  Administered 2021-06-24: 20 mg via INTRAVENOUS
  Administered 2021-06-24: 10 mg via INTRAVENOUS

## 2021-06-24 MED ORDER — OXYCODONE HCL 5 MG PO TABS
5.0000 mg | ORAL_TABLET | Freq: Four times a day (QID) | ORAL | 0 refills | Status: DC | PRN
  Filled 2021-06-24: qty 10, 3d supply, fill #0

## 2021-06-24 MED ORDER — STERILE WATER FOR IRRIGATION IR SOLN
Status: DC | PRN
  Administered 2021-06-24: 1000 mL

## 2021-06-24 MED ORDER — BUPIVACAINE LIPOSOME 1.3 % IJ SUSP
INTRAMUSCULAR | Status: AC
  Filled 2021-06-24: qty 20

## 2021-06-24 SURGICAL SUPPLY — 78 items
ADH SKN CLS APL DERMABOND .7 (GAUZE/BANDAGES/DRESSINGS) ×1
APL PRP STRL LF DISP 70% ISPRP (MISCELLANEOUS) ×1
APPLIER CLIP 5 13 M/L LIGAMAX5 (MISCELLANEOUS)
APPLIER CLIP ROT 10 11.4 M/L (STAPLE)
APR CLP MED LRG 11.4X10 (STAPLE)
APR CLP MED LRG 5 ANG JAW (MISCELLANEOUS)
BLADE SURG SZ11 CARB STEEL (BLADE) ×3 IMPLANT
CANNULA REDUC XI 12-8 STAPL (CANNULA) ×2
CANNULA REDUCER 12-8 DVNC XI (CANNULA) ×2 IMPLANT
CHLORAPREP W/TINT 26 (MISCELLANEOUS) ×3 IMPLANT
CLIP APPLIE 5 13 M/L LIGAMAX5 (MISCELLANEOUS) IMPLANT
CLIP APPLIE ROT 10 11.4 M/L (STAPLE) IMPLANT
COVER SURGICAL LIGHT HANDLE (MISCELLANEOUS) ×3 IMPLANT
COVER TIP SHEARS 8 DVNC (MISCELLANEOUS) ×2 IMPLANT
COVER TIP SHEARS 8MM DA VINCI (MISCELLANEOUS) ×2
DERMABOND ADVANCED (GAUZE/BANDAGES/DRESSINGS) ×1
DERMABOND ADVANCED .7 DNX12 (GAUZE/BANDAGES/DRESSINGS) ×2 IMPLANT
DRAPE ARM DVNC X/XI (DISPOSABLE) ×8 IMPLANT
DRAPE COLUMN DVNC XI (DISPOSABLE) ×2 IMPLANT
DRAPE DA VINCI XI ARM (DISPOSABLE) ×8
DRAPE DA VINCI XI COLUMN (DISPOSABLE) ×2
ELECT REM PT RETURN 15FT ADLT (MISCELLANEOUS) ×3 IMPLANT
GAUZE 4X4 16PLY ~~LOC~~+RFID DBL (SPONGE) ×3 IMPLANT
GLOVE SURG ENC MOIS LTX SZ7.5 (GLOVE) ×6 IMPLANT
GLOVE SURG UNDER LTX SZ8 (GLOVE) ×6 IMPLANT
GOWN STRL REUS W/TWL XL LVL3 (GOWN DISPOSABLE) ×9 IMPLANT
GRASPER SUT TROCAR 14GX15 (MISCELLANEOUS) ×3 IMPLANT
IRRIG SUCT STRYKERFLOW 2 WTIP (MISCELLANEOUS) ×2
IRRIGATION SUCT STRKRFLW 2 WTP (MISCELLANEOUS) ×2 IMPLANT
KIT BASIN OR (CUSTOM PROCEDURE TRAY) ×3 IMPLANT
KIT GASTRIC LAVAGE 34FR ADT (SET/KITS/TRAYS/PACK) ×3 IMPLANT
KIT TURNOVER KIT A (KITS) ×1 IMPLANT
LUBRICANT JELLY K Y 4OZ (MISCELLANEOUS) IMPLANT
MARKER SKIN DUAL TIP RULER LAB (MISCELLANEOUS) ×3 IMPLANT
MAT PREVALON FULL STRYKER (MISCELLANEOUS) ×3 IMPLANT
NDL SPNL 18GX3.5 QUINCKE PK (NEEDLE) ×2 IMPLANT
NEEDLE SPNL 18GX3.5 QUINCKE PK (NEEDLE) ×2 IMPLANT
OBTURATOR OPTICAL STANDARD 8MM (TROCAR) ×2
OBTURATOR OPTICAL STND 8 DVNC (TROCAR) ×1
OBTURATOR OPTICALSTD 8 DVNC (TROCAR) ×2 IMPLANT
PACK CARDIOVASCULAR III (CUSTOM PROCEDURE TRAY) ×3 IMPLANT
RELOAD STAPLE 60 2.5 WHT DVNC (STAPLE) ×6 IMPLANT
RELOAD STAPLE 60 3.5 BLU DVNC (STAPLE) IMPLANT
RELOAD STAPLER 2.5X60 WHT DVNC (STAPLE) ×9 IMPLANT
RELOAD STAPLER 3.5X60 BLU DVNC (STAPLE) IMPLANT
SCISSORS LAP 5X35 DISP (ENDOMECHANICALS) ×1 IMPLANT
SEAL CANN UNIV 5-8 DVNC XI (MISCELLANEOUS) ×6 IMPLANT
SEAL XI 5MM-8MM UNIVERSAL (MISCELLANEOUS) ×6
SEALER SYNCHRO 8 IS4000 DV (MISCELLANEOUS) ×2
SEALER SYNCHRO 8 IS4000 DVNC (MISCELLANEOUS) ×2 IMPLANT
SOL ANTI FOG 6CC (MISCELLANEOUS) ×2 IMPLANT
SOLUTION ANTI FOG 6CC (MISCELLANEOUS) ×1
SOLUTION ELECTROLUBE (MISCELLANEOUS) ×3 IMPLANT
SPIKE FLUID TRANSFER (MISCELLANEOUS) ×2 IMPLANT
STAPLER 60 DA VINCI SURE FORM (STAPLE) ×2
STAPLER 60 SUREFORM DVNC (STAPLE) ×2 IMPLANT
STAPLER CANNULA SEAL DVNC XI (STAPLE) ×2 IMPLANT
STAPLER CANNULA SEAL XI (STAPLE) ×2
STAPLER RELOAD 2.5X60 WHITE (STAPLE) ×18
STAPLER RELOAD 2.5X60 WHT DVNC (STAPLE) ×9
STAPLER RELOAD 3.5X60 BLU DVNC (STAPLE)
STAPLER RELOAD 3.5X60 BLUE (STAPLE)
SUT ETHIBOND 0 (SUTURE) ×4 IMPLANT
SUT MNCRL AB 4-0 PS2 18 (SUTURE) ×3 IMPLANT
SUT V-LOC BARB 180 2/0GR6 GS22 (SUTURE) ×4
SUT VIC AB 2-0 SH 27 (SUTURE)
SUT VIC AB 2-0 SH 27XBRD (SUTURE) IMPLANT
SUT VICRYL 0 TIES 12 18 (SUTURE) ×3 IMPLANT
SUT VLOC BARB 180 ABS3/0GR12 (SUTURE) ×4
SUTURE V-LC BRB 180 2/0GR6GS22 (SUTURE) ×4 IMPLANT
SUTURE VLOC BRB 180 ABS3/0GR12 (SUTURE) ×4 IMPLANT
SYR 20ML LL LF (SYRINGE) ×3 IMPLANT
TOWEL OR 17X26 10 PK STRL BLUE (TOWEL DISPOSABLE) ×3 IMPLANT
TRAY FOLEY MTR SLVR 16FR STAT (SET/KITS/TRAYS/PACK) IMPLANT
TROCAR ADV FIXATION 12X100MM (TROCAR) ×2 IMPLANT
TROCAR Z-THREAD FIOS 5X100MM (TROCAR) ×3 IMPLANT
TUBE CALIBRATION LAPBAND (TUBING) IMPLANT
TUBING INSUFFLATION 10FT LAP (TUBING) ×3 IMPLANT

## 2021-06-24 NOTE — Discharge Instructions (Signed)

## 2021-06-24 NOTE — Progress Notes (Signed)
PHARMACY CONSULT FOR:  Risk Assessment for Post-Discharge VTE Following Bariatric Surgery  Post-Discharge VTE Risk Assessment: This patient's probability of 30-day post-discharge VTE is increased due to the factors marked:  Sleeve gastrectomy   Liver disorder (transplant, cirrhosis, or nonalcoholic steatohepatitis)   Hx of VTE   Hemorrhage requiring transfusion   GI perforation, leak, or obstruction   ====================================================    Female    Age >/=60 years    BMI >/=50 kg/m2    CHF    Dyspnea at Rest    Paraplegia  x  Non-gastric-band surgery    Operation Time >/=3 hr    Return to OR     Length of Stay >/= 3 d   Hypercoagulable condition   Significant venous stasis      Predicted probability of 30-day post-discharge VTE: -0.16%  Other patient-specific factors to consider: - No listed history of DVT/PE - Pt does not have 2 or more risk factors for portomesenteric vein thrombosis.    Recommendation for Discharge: No pharmacologic prophylaxis post-discharge        Tamara Malone is a 56 y.o. female who underwent  robot assisted gastric bypass Roux-En-Y with upper endoscopy  on 06/24/21     Case start: 0749 Case end: 1046   No Known Allergies  Patient Measurements: Height: 5\' 4"  (162.6 cm) Weight: 108.4 kg (239 lb) IBW/kg (Calculated) : 54.7 Body mass index is 41.02 kg/m.  No results for input(s): WBC, HGB, HCT, PLT, APTT, CREATININE, LABCREA, CREATININE, CREAT24HRUR, MG, PHOS, ALBUMIN, PROT, ALBUMIN, AST, ALT, ALKPHOS, BILITOT, BILIDIR, IBILI in the last 72 hours. Estimated Creatinine Clearance: 95.6 mL/min (by C-G formula based on SCr of 0.78 mg/dL).    Past Medical History:  Diagnosis Date   Arthritis    Hypertension    Migraines    once very couple months    Obesity    Primary localized osteoarthritis of left knee 02/01/2019     Medications Prior to Admission  Medication Sig Dispense Refill Last Dose    hydrochlorothiazide (HYDRODIURIL) 12.5 MG tablet Take 12.5 mg by mouth 2 (two) times daily.    06/23/2021        Royetta Asal, PharmD, BCPS 06/24/2021 11:26 AM

## 2021-06-24 NOTE — Anesthesia Preprocedure Evaluation (Signed)
Anesthesia Evaluation  Patient identified by MRN, date of birth, ID band Patient awake    Reviewed: Allergy & Precautions, NPO status , Patient's Chart, lab work & pertinent test results  History of Anesthesia Complications Negative for: history of anesthetic complications  Airway Mallampati: I  TM Distance: >3 FB Neck ROM: Full    Dental  (+) Dental Advisory Given   Pulmonary neg pulmonary ROS,    breath sounds clear to auscultation       Cardiovascular hypertension, Pt. on medications (-) angina Rhythm:Regular Rate:Normal     Neuro/Psych  Headaches,    GI/Hepatic Neg liver ROS, GERD  Controlled,  Endo/Other  Morbid obesity  Renal/GU negative Renal ROS     Musculoskeletal  (+) Arthritis ,   Abdominal (+) + obese,   Peds  Hematology negative hematology ROS (+)   Anesthesia Other Findings   Reproductive/Obstetrics                             Anesthesia Physical  Anesthesia Plan  ASA: 3  Anesthesia Plan: General   Post-op Pain Management:    Induction: Intravenous  PONV Risk Score and Plan: 3 and Ondansetron, Dexamethasone, Midazolam and Treatment may vary due to age or medical condition  Airway Management Planned: Oral ETT  Additional Equipment: None  Intra-op Plan:   Post-operative Plan: Extubation in OR  Informed Consent: I have reviewed the patients History and Physical, chart, labs and discussed the procedure including the risks, benefits and alternatives for the proposed anesthesia with the patient or authorized representative who has indicated his/her understanding and acceptance.     Dental advisory given  Plan Discussed with: CRNA and Surgeon  Anesthesia Plan Comments:         Anesthesia Quick Evaluation

## 2021-06-24 NOTE — Anesthesia Procedure Notes (Signed)
Procedure Name: Intubation Date/Time: 06/24/2021 11:34 AM Performed by: Eben Burow, CRNA Pre-anesthesia Checklist: Patient identified, Emergency Drugs available, Suction available, Patient being monitored and Timeout performed Patient Re-evaluated:Patient Re-evaluated prior to induction Oxygen Delivery Method: Circle system utilized Preoxygenation: Pre-oxygenation with 100% oxygen Induction Type: IV induction Ventilation: Mask ventilation without difficulty Laryngoscope Size: Mac and 4 Grade View: Grade I Tube type: Oral Tube size: 7.0 mm Number of attempts: 1 Airway Equipment and Method: Stylet Placement Confirmation: ETT inserted through vocal cords under direct vision, positive ETCO2 and breath sounds checked- equal and bilateral Secured at: 22 cm Tube secured with: Tape Dental Injury: Teeth and Oropharynx as per pre-operative assessment

## 2021-06-24 NOTE — H&P (Signed)
Admitting Physician: Nickola Major Andra Heslin  Service: Bariatric surgery  CC: Obesity  Subjective   HPI: Tamara Malone is an 56 y.o. female who is here for elective robotic gastric bypass.  Past Medical History:  Diagnosis Date   Arthritis    Hypertension    Migraines    once very couple months    Obesity    Primary localized osteoarthritis of left knee 02/01/2019    Past Surgical History:  Procedure Laterality Date   ABDOMINAL HYSTERECTOMY     BIOPSY  02/26/2021   Procedure: BIOPSY;  Surgeon: Felicie Morn, MD;  Location: [facility];  Service: General;;   ESOPHAGOGASTRODUODENOSCOPY N/A 02/26/2021   Procedure: ESOPHAGOGASTRODUODENOSCOPY (EGD);  Surgeon: Felicie Morn, MD;  Location: [facility];  Service: General;  Laterality: N/A;   PARTIAL KNEE ARTHROPLASTY Left 02/01/2019   Procedure: UNICOMPARTMENTAL LEFT KNEE;  Surgeon: Marchia Bond, MD;  Location: [facility] ORS;  Service: Orthopedics;  Laterality: Left;    Family History  Problem Relation Age of Onset   Hypertension Mother    Diabetes Mother    Diabetes Sister    Hypertension Brother    Diabetes Brother    Breast cancer Maternal Aunt    Heart disease Maternal Grandmother    Hypertension Other    Hyperlipidemia Other    Diabetes Other    Stroke Other    Heart disease Other    Colon cancer Neg Hx    Sleep apnea Neg Hx     Social:  reports that she has never smoked. She has never used smokeless tobacco. She reports that she does not drink alcohol and does not use drugs.  Allergies: No Known Allergies  Medications: Current Outpatient Medications  Medication Instructions   hydrochlorothiazide (HYDRODIURIL) 12.5 mg, Oral, 2 times daily    ROS - all of the below systems have been reviewed with the patient and positives are indicated with bold text General: chills, fever or night sweats Eyes: blurry vision or double vision ENT: epistaxis or sore throat Allergy/Immunology: itchy/watery eyes or  nasal congestion Hematologic/Lymphatic: bleeding problems, blood clots or swollen lymph nodes Endocrine: temperature intolerance or unexpected weight changes Breast: new or changing breast lumps or nipple discharge Resp: cough, shortness of breath, or wheezing CV: chest pain or dyspnea on exertion GI: as per HPI GU: dysuria, trouble voiding, or hematuria MSK: joint pain or joint stiffness Neuro: TIA or stroke symptoms Derm: pruritus and skin lesion changes Psych: anxiety and depression  Objective   PE Blood pressure 127/84, pulse 74, temperature 98.9 F (37.2 C), temperature source Oral, resp. rate 16, height 5\' 4"  (1.626 m), weight 108.4 kg, SpO2 100 %. Constitutional: NAD; conversant; no deformities Eyes: Moist conjunctiva; no lid lag; anicteric; PERRL Neck: Trachea midline; no thyromegaly Lungs: Normal respiratory effort; no tactile fremitus CV: RRR; no palpable thrills; no pitting edema GI: Abd Soft, nontender; no palpable hepatosplenomegaly MSK: Normal range of motion of extremities; no clubbing/cyanosis Psychiatric: Appropriate affect; alert and oriented x3 Lymphatic: No palpable cervical or axillary lymphadenopathy  Results for orders placed or performed during the hospital encounter of 06/24/21 (from the past 24 hour(s))  ABO/Rh     Status: None   Collection Time: 06/24/21  5:25 AM  Result Value Ref Range   ABO/RH(D)      O POS Performed at [facility], [place] [address], [place], [place] [address]     Imaging Orders  No imaging studies ordered today     Assessment and  Plan   Tamara Malone is an 56 y.o. female with morbid obesity, BMI 42.33, and the following comorbidity related to obesity: hypertension and hiatal hernia.   [name] presented for bariatric surgery consultation. I reviewed the current bariatric surgeries available, and specifically the ones I offer including robotic sleeve gastrectomy robotic Roux-en-Y gastric bypass. I  entered her information into the [facility] metabolic surgery risk/benefit calculator. After full discussion and all questions answered, the patient is interested in Roux-en-Y gastric bypass for the larger amount of weight loss.    The preoperative pathway included the following: - Bloodwork - Dietitian consult - complete with Sandie Ano, RD - Chest x-ray 11/16/20 - No active cardiopulmonary disease - EKG - NSR - Sleep study - Psychology evaluation - Upper endoscopy with biopsy - hiatal hernia identified  Today she presents for robotic gastric bypass with upper endoscopy and hiatal hernia repair.  The procedure itself as well as its risks, benefits and alternatives were discussed and the patient granted consent to proceed.  We will proceed as scheduled.  Felicie Morn, MD  [facility] Use [email or web address] to contact on call provider

## 2021-06-24 NOTE — Op Note (Signed)
Patient: Tamara Malone (Jun 04, 1965, [id number])  Date of Surgery: 06/24/2021   Preoperative Diagnosis: Morbid obesity, Hiatal hernia  Postoperative Diagnosis: Morbid obesity, Hiatal hernia  Surgical Procedure: XI ROBOT ASSISTED GASTRIC BYPASS ROUX-EN-Y WITH UPPER ENDO: 46270 (CPT) HERNIA REPAIR HIATAL:    Operative Team Members:  Surgeon(s) and Role:    * Tamara Malone, Nickola Major, MD - Primary    * Tamara Hausen, MD - Assisting   Anesthesiologist: Tamara Rainwater, MD CRNA: Tamara Lain, CRNA; Tamara Burow, CRNA   Anesthesia: General   Fluids:  Total I/O In: 1000 [I.V.:1000] Out: 140 [Urine:120; JJKKX:38]  Complications: None  Drains:  none   Specimen: None  Disposition:  PACU - hemodynamically stable.  Plan of Care: Admit to inpatient     Indications for Procedure: Tamara Malone is an 56 y.o. female with morbid obesity, BMI 42.33, and the following comorbidity related to obesity: hypertension and hiatal hernia.   Tamara Malone presented for bariatric surgery consultation. I reviewed the current bariatric surgeries available, and specifically the ones I offer including robotic sleeve gastrectomy robotic Roux-en-Y gastric bypass. I entered her information into the [facility] metabolic surgery risk/benefit calculator. After full discussion and all questions answered, the patient is interested in Roux-en-Y gastric bypass for the larger amount of weight loss.    The preoperative pathway included the following: - Bloodwork - Dietitian consult - complete with Tamara Malone, RD - Chest x-ray 11/16/20 - No active cardiopulmonary disease - EKG - NSR - Sleep study - Psychology evaluation - Upper endoscopy with biopsy - hiatal hernia identified   Today she presents for robotic gastric bypass with upper endoscopy and hiatal hernia repair.  The procedure itself as well as its risks, benefits and alternatives were discussed and the patient granted consent to proceed.  We  will proceed as scheduled.   Findings: Small sliding type hiatal hernia also containing perigastric fat  Infection status: Patient: Private Patient Elective Case Case: Elective Infection Present At Time Of Surgery (PATOS): Some spillage of foregut  and jejunal contents while creating anastomoses   Description of Procedure:   On the date stated above, the patient was taken to the operating room suite and placed in supine positioning.  General endotracheal anesthesia was induced.  A timeout was completed verifying the correct patient, procedure, positioning and equipment needed for the case.  The patient's abdomen was prepped and draped in the usual sterile fashion.  A 5 mm trocar was used to enter the right upper quadrant using optical technique.  The abdomen was entered safely without any trauma the underlying viscera.  Three additional incisions were made and 4 robotic trochars were placed across the abdomen, replacing the 5 mm trocar with the 12 mm robotic stapler trocar. An assistant 22mm trocar was placed in the left lower quadrant.   The [facility] liver retractor was placed through the subxiphoid region and under the left lobe of the liver and was connected to the rail of the bed.  A TAP block was placed using marcaine and Exparel under direct vision of the laparoscope.  There were adhesions of the omentum to the pelvis which were carefully lysed with laparoscopic scissors to free up the omentum for later in the case.  The [place] was docked and we transitioned to robotic surgery.  The gastrohepatic ligament was divided and the right crus was identified.  A blunt dissection was carried out between the right crus and the esophagus.  The  phrenoesophageal ligament was divided and dissection was carried up over the top of the esophagus towards the left crus.  The fundus was partially mobilized off of the left hemidiaphragm.    A high, circumferential mediastinal dissection was  performed in an effort to mobilize the esophagus and provide for adequate intraabdominal esophageal length.  The mediastinal dissection was performed bluntly, with little to no thermal energy.  The anterior and posterior vagus nerves were both identified and preserved.  The crural defect was reapproximated with three, interrupted, 0 Ethibond sutures.  The crural pillars came together well without tearing of the adjacent diaphragmatic tissue.    Using the tip up grasper, fenestrated bipolar, 30 degree camera and Synchroseal from the patient's right to left, I then started 4-6 cm down on the lesser curve of the stomach and created a defect in the gastrohepatic ligament tracking behind the lesser curve of the stomach to enter the lesser sac.  I then used multiple white loads of the robotic 60 mm Sureform linear stapler to form the gastric pouch.  I then directed my attention to the lower abdomen.  The omentum was divided with the Synchroseal and I identified the ligament of treitz.  The jejunum was run to a point 50 cm from the ligament of Treitz.  This loop of bowel was then brought into the left upper quadrant, over the transverse colon, between the split omentum.  A 2-0 v-loc suture was used to create the posterior outer row of the gastrojejunal anastomosis.  An approximately 2 cm gastrotomy was made in the pouch and a matching 2 cm enterotomy was created in the roux limb.  Then, two 3-0 v-loc sutures were used to create a posterior, inner, full thickness layer of the anastomosis.  While sewing the anterior inner layer, the Ewald tube was passed through the anastomosis to ensure patency.  The outer, anterior layer was then created using 2-0 v-loc suture.  A window was made in the jejunal mesentery and the jejunum was divided just proximal to the gastrojejunal anastomosis using a white load of the robotic 60 mm Sureform linear stapler to divide the roux limb from the hepatobiliary limb.  At this point the  gastrojejunal anastomosis was complete and the Ewald tube was removed.  The roux limb was then run 100 cm from the gastrojejunal anastomosis.  This loop of bowel was brought into the left upper quadrant for anastomosis to the hepatobiliary limb.  A robotic 60 mm white load on the Sureform linear stapler was introduced into both limbs and fired to create the jejunojejunostomy.  A second 60 mm white load of the Sureform linear stapler was fired to connect the distal Roux limb to the proximal common channel creating a W shaped anastomosis.  The common enterotomy was closed with a final 60 mm white load of the Sureform linear stapler.  The jejunojejunostomy mesenteric defect was closed using running 0-Ethibond suture.   The retro-roux defect was closed, closing the roux limb mesentery to the transverse mesocolon mesentery using a running 0-Ethibond suture.   I ran the roux limb from the gastrojejunal anastomosis to the jejunojejunostomy and found the anatomy as expected without any twisting of the mesentery.  I then transitioned to endoscopic portion of the procedure.  The adult upper endoscope was inserted into the pouch.  The pouch appeared appropriately sized.  There was good intra-luminal hemostasis.  The endoscope was inserted through the anastomosis into the roux limb.  The anastomosis appeared well formed without  any stricture.  The anastomosis was submerged in irrigation in the left upper quadrant and there was no leakage of air bubbles with endoscopic insufflation suggesting a negative leak test and an air tight anastomosis.    The foregut was decompressed with the endoscope and the endoscope was removed.  The robotic instruments were removed and the robot was undocked.  The stapler port in the right abdomen was closed at the fascial level using 0-vicryl on a PMI suture passer.  The liver retractor was removed under direct vision.  The pneumoperitoneum was evacuated.  The skin was closed using 4-0 Monocryl  and Dermabond.  All sponge and needle counts were correct at the end of the case.    Louanna Raw, MD General, Bariatric, & Minimally Invasive Surgery [facility], [place]

## 2021-06-24 NOTE — Anesthesia Postprocedure Evaluation (Signed)
Anesthesia Post Note  Patient: Tamara Malone  Procedure(s) Performed: XI ROBOT ASSISTED GASTRIC BYPASS ROUX-EN-Y WITH UPPER ENDO (Abdomen) HERNIA REPAIR HIATAL (Abdomen)     Patient location during evaluation: PACU Anesthesia Type: General Level of consciousness: awake and alert Pain management: pain level controlled Vital Signs Assessment: post-procedure vital signs reviewed and stable Respiratory status: spontaneous breathing, nonlabored ventilation and respiratory function stable Cardiovascular status: blood pressure returned to baseline and stable Postop Assessment: no apparent nausea or vomiting Anesthetic complications: no   No notable events documented.  Last Vitals:  Vitals:   06/24/21 1130 06/24/21 1200  BP: 117/79 116/81  Pulse: 77 84  Resp: 15 15  Temp:    SpO2: 93% 93%    Last Pain:  Vitals:   06/24/21 1200  TempSrc:   PainSc: Asleep                 Lynda Rainwater

## 2021-06-24 NOTE — Progress Notes (Signed)

## 2021-06-24 NOTE — Plan of Care (Signed)
Pt ambulated from stretcher to chair. Pt demonstrated correct usage of IS reaching up to 1500. Pt has no complaints at this time.

## 2021-06-24 NOTE — Transfer of Care (Signed)
Immediate Anesthesia Transfer of Care Note  Patient: Tamara Malone  Procedure(s) Performed: Henreitta Cea ASSISTED GASTRIC BYPASS ROUX-EN-Y WITH UPPER ENDO (Abdomen) HERNIA REPAIR HIATAL (Abdomen)  Patient Location: PACU  Anesthesia Type:General  Level of Consciousness: awake, alert , oriented and patient cooperative  Airway & Oxygen Therapy: Patient Spontanous Breathing and Patient connected to face mask oxygen  Post-op Assessment: Report given to RN, Post -op Vital signs reviewed and stable and Patient moving all extremities  Post vital signs: Reviewed and stable  Last Vitals:  Vitals Value Taken Time  BP 123/89 06/24/21 1055  Temp    Pulse 83 06/24/21 1057  Resp 16 06/24/21 1057  SpO2 100 % 06/24/21 1057  Vitals shown include unvalidated device data.  Last Pain:  Vitals:   06/24/21 0551  TempSrc: Oral         Complications: No notable events documented.

## 2021-06-25 ENCOUNTER — Other Ambulatory Visit (HOSPITAL_COMMUNITY): Payer: Self-pay

## 2021-06-25 ENCOUNTER — Encounter (HOSPITAL_COMMUNITY): Payer: Self-pay | Admitting: Surgery

## 2021-06-25 LAB — CBC WITH DIFFERENTIAL/PLATELET
Abs Immature Granulocytes: 0.02 10*3/uL (ref 0.00–0.07)
Basophils Absolute: 0 10*3/uL (ref 0.0–0.1)
Basophils Relative: 0 %
Eosinophils Absolute: 0 10*3/uL (ref 0.0–0.5)
Eosinophils Relative: 0 %
HCT: 34.3 % — ABNORMAL LOW (ref 36.0–46.0)
Hemoglobin: 11.1 g/dL — ABNORMAL LOW (ref 12.0–15.0)
Immature Granulocytes: 0 %
Lymphocytes Relative: 17 %
Lymphs Abs: 1.4 10*3/uL (ref 0.7–4.0)
MCH: 27.2 pg (ref 26.0–34.0)
MCHC: 32.4 g/dL (ref 30.0–36.0)
MCV: 84.1 fL (ref 80.0–100.0)
Monocytes Absolute: 0.5 10*3/uL (ref 0.1–1.0)
Monocytes Relative: 7 %
Neutro Abs: 5.9 10*3/uL (ref 1.7–7.7)
Neutrophils Relative %: 76 %
Platelets: 319 10*3/uL (ref 150–400)
RBC: 4.08 MIL/uL (ref 3.87–5.11)
RDW: 16.4 % — ABNORMAL HIGH (ref 11.5–15.5)
WBC: 7.8 10*3/uL (ref 4.0–10.5)
nRBC: 0 % (ref 0.0–0.2)

## 2021-06-25 NOTE — Progress Notes (Signed)
Patient alert and oriented, pain is controlled. Patient is tolerating fluids, advanced to protein shake today, patient is tolerating well. Reviewed Gastric Bypass discharge instructions with patient and patient is able to articulate understanding. Provided information on BELT program, Support Group and [facility] outpatient pharmacy. All questions answered, will continue to monitor.    

## 2021-06-25 NOTE — Progress Notes (Signed)
Patient was given discharge instructions, and all questions were answered. Patient was stable for discharge and was walked to the main exit. 

## 2021-06-25 NOTE — Discharge Summary (Signed)
°  Patient ID: Tamara Malone [id number] 55 y.o. Feb 22, 1966  06/24/2021  Discharge date and time: 06/25/2021  Admitting Physician: Tamara Malone  Discharge Physician: Tamara Malone  Admission Diagnoses: Morbid obesity with BMI of 40.0-44.9, adult (Tamara Malone) [E66.01, Z68.41] Patient Active Problem List   Diagnosis Date Noted   Morbid obesity with BMI of 40.0-44.9, adult (Tamara Malone) 06/24/2021   Primary localized osteoarthritis of left knee 02/01/2019     Discharge Diagnoses: Morbid obesity Patient Active Problem List   Diagnosis Date Noted   Morbid obesity with BMI of 40.0-44.9, adult (Tamara Malone) 06/24/2021   Primary localized osteoarthritis of left knee 02/01/2019    Operations: Procedure(s): XI ROBOT ASSISTED GASTRIC BYPASS ROUX-EN-Y WITH UPPER ENDO HERNIA REPAIR HIATAL  Admission Condition: good  Discharged Condition: good  Indication for Admission: Morbid obesity  Malone Course: [name] presented for robotic gastric bypass with hiatal hernia repair.  She was discharged the following day.  Consults: None  Significant Diagnostic Studies: None  Treatments: Surgery as above  Disposition: Home  Patient Instructions:  Allergies as of 06/25/2021   No Known Allergies      Medication List     TAKE these medications    acetaminophen 500 MG tablet Commonly known as: TYLENOL Take 2 tablets (1,000 mg total) by mouth every 8 (eight) hours for 5 days.   gabapentin 100 MG capsule Commonly known as: NEURONTIN Take 2 capsules (200 mg total) by mouth every 12 (twelve) hours.   hydrochlorothiazide 12.5 MG tablet Commonly known as: HYDRODIURIL Take 12.5 mg by mouth 2 (two) times daily. Notes to patient: Monitor Blood Pressure Daily and keep a log for primary care physician.  Monitor for symptoms of dehydration.  You may need to make changes to your medications with rapid weight loss.     ondansetron 4 MG disintegrating tablet Commonly known as: ZOFRAN-ODT Dissolve 1  tablet (4 mg total) by mouth every 6 (six) hours as needed for nausea or vomiting.   oxyCODONE 5 MG immediate release tablet Commonly known as: Oxy IR/ROXICODONE Take 1 tablet (5 mg total) by mouth every 6 (six) hours as needed for severe pain.   pantoprazole 40 MG tablet Commonly known as: PROTONIX Take 1 tablet (40 mg total) by mouth daily.        Activity: activity as tolerated Diet:  bariatric protocol Wound Care: keep wound clean and dry  Follow-up:  With Dr. Thermon Malone in per follow up protocol  Signed: [place], Bariatric, & Minimally Invasive Surgery [facility]   06/25/2021, 10:14 AM

## 2021-06-25 NOTE — Progress Notes (Signed)
Transition of Care [facility]) Screening Note  Patient Details  Name: JESSI PITSTICK Date of Birth: 1965-12-18  Transition of Care [facility]) CM/SW Contact:    Sherie Don, LCSW Phone Number: 06/25/2021, 10:21 AM  Transition of Care Department [facility]) has reviewed patient and no TOC needs have been identified at this time. We will continue to monitor patient advancement through interdisciplinary progression rounds. If new patient transition needs arise, please place a TOC consult.

## 2021-06-26 NOTE — Progress Notes (Signed)
24hr fluid recall prior to discharge: 540mL. Per dehydration protocol, will call pt to f/u within one week post op. 

## 2021-06-28 ENCOUNTER — Telehealth (HOSPITAL_COMMUNITY): Payer: Self-pay | Admitting: *Deleted

## 2021-06-28 NOTE — Telephone Encounter (Signed)
1.  Tell me about your pain and pain management? Pt denies any pain.  2.  Let's talk about fluid intake.  How much total fluid are you taking in? Pt states that she is working to meet goal of 64 oz of fluid today.  Pt has been able to consume approx. 50 oz of fluid per day since surgery.  Pt plans to increase clear liquids to meet fluid goals. Pt instructed to assess status and suggestions daily utilizing Hydration Action Plan on discharge folder and to call [facility] if in the "red zone".   3.  How much protein have you taken in the last 2 days? Pt states that she is working to meet goal of goal of 60g of protein today.  Pt has already consumed 1.5 protein shake(s).  Pt plans to drink remainder of protein throughout the rest of the day to meet goal.  4.  Have you had nausea?  Tell me about when have experienced nausea and what you did to help? Pt denies nausea.   5.  Has the frequency or color changed with your urine? Pt states that she is urinating "fine" with no changes in frequency or urgency.     6.  Tell me what your incisions look like? "Incisions look fine". Pt denies a fever, chills.  Pt states incisions are not swollen, open, or draining.  Pt encouraged to call [facility] if incisions change.   7.  Have you been passing gas? BM? Pt states that she is having BMs. Last BM 06/28/21.     8.  If a problem or question were to arise who would you call?  Do you know contact numbers for [facility], [facility], and [facility]? Pt denies dehydration symptoms.  Pt can describe s/sx of dehydration.  Pt knows to call [facility] for surgical, [facility] for nutrition, and [facility] for non-urgent questions or concerns.   9.  How has the walking going? Pt states she is walking around and able to be active without difficulty.   10. Are you still using your incentive spirometer?  If so, how often? Pt states that she is using I.S. TID, 10x each achieving 1500. Pt encouraged to use incentive spirometer, at least 10x every hour while awake until she  sees the surgeon.  11.  How are your vitamins and calcium going?  How are you taking them? Pt states that she is taking her supplements and vitamins without difficulty.  Reminded patient that the first 30 days post-operatively are important for successful recovery.  Practice good hand hygiene, wearing a mask when appropriate (since optional in most places), and minimizing exposure to people who live outside of the home, especially if they are exhibiting any respiratory, GI, or illness-like symptoms.

## 2021-07-09 ENCOUNTER — Encounter: Payer: 59 | Attending: Surgery | Admitting: Skilled Nursing Facility1

## 2021-07-09 ENCOUNTER — Other Ambulatory Visit: Payer: Self-pay

## 2021-07-09 DIAGNOSIS — Z6841 Body Mass Index (BMI) 40.0 and over, adult: Secondary | ICD-10-CM | POA: Insufficient documentation

## 2021-07-11 NOTE — Progress Notes (Signed)
2 Week Post-Operative Nutrition Class   Patient was seen on 07/09/2021 for Post-Operative Nutrition education at the [facility].    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 Start weight at [facility]: 246.4 Weight today: 231.5 pounds Bowel Habits: Every day to every other day no complaints   Body Composition Scale 07/09/2021  Current Body Weight 231.5  Total Body Fat % 44.6  Visceral Fat 16  Fat-Free Mass % 55.3   Total Body Water % 42.1  Muscle-Mass lbs 30.2  BMI 39.6  Body Fat Displacement          Torso  lbs 64         Left Leg  lbs 12.8         Right Leg  lbs 12.8         Left Arm  lbs 6.4         Right Arm   lbs 6.4      The following the learning objectives were met by the patient during this course: Identifies Phase 3 (Soft, High Proteins) Dietary Goals and will begin from 2 weeks post-operatively to 2 months post-operatively Identifies appropriate sources of fluids and proteins  Identifies appropriate fat sources and healthy verses unhealthy fat types   States protein recommendations and appropriate sources post-operatively Identifies the need for appropriate texture modifications, mastication, and bite sizes when consuming solids Identifies appropriate fat consumption and sources Identifies appropriate multivitamin and calcium sources post-operatively Describes the need for physical activity post-operatively and will follow MD recommendations States when to call healthcare provider regarding medication questions or post-operative complications   Handouts given during class include: Phase 3A: Soft, High Protein Diet Handout Phase 3 High Protein Meals Healthy Fats   Follow-Up Plan: Patient will follow-up at [facility] in 6 weeks for 2 month post-op nutrition visit for diet advancement per MD.

## 2021-07-15 ENCOUNTER — Telehealth: Payer: Self-pay | Admitting: Skilled Nursing Facility1

## 2021-07-15 NOTE — Telephone Encounter (Signed)
RD called pt to verify fluid intake once starting soft, solid proteins 2 week post-bariatric surgery.   Daily Fluid intake: 64 oz Daily Protein intake: 60 g Bowel Habits: every day to every other day  Concerns/issues:   None stated 

## 2021-08-20 ENCOUNTER — Encounter: Payer: Self-pay | Admitting: Skilled Nursing Facility1

## 2021-08-20 ENCOUNTER — Encounter: Payer: 59 | Attending: Surgery | Admitting: Skilled Nursing Facility1

## 2021-08-20 DIAGNOSIS — Z6841 Body Mass Index (BMI) 40.0 and over, adult: Secondary | ICD-10-CM | POA: Diagnosis present

## 2021-08-20 NOTE — Progress Notes (Signed)
Bariatric Nutrition Follow-Up Visit ?Medical Nutrition Therapy  ? ? ?NUTRITION ASSESSMENT ?  ? ?Surgery date: 06/24/2021 ?Surgery type: RYGB ?Start weight at [facility]: 246.4 ?Weight today: 213.8 pounds ?  ?Body Composition Scale 07/09/2021 08/20/2021  ?Current Body Weight 231.5 213.8  ?Total Body Fat % 44.6 42.5  ?Visceral Fat 16 14  ?Fat-Free Mass % 55.3 57.4  ? Total Body Water % 42.1 43.2  ?Muscle-Mass lbs 30.2 30  ?BMI 39.6 36.5  ?Body Fat Displacement    ?       Torso  lbs 64 56.3  ?       Left Leg  lbs 12.8 11.2  ?       Right Leg  lbs 12.8 11.2  ?       Left Arm  lbs 6.4 5.6  ?       Right Arm   lbs 6.4 5.6  ? ?Clinical  ?Medical hx: HTN, arthritis  ?Medications: HTZ  ?Labs: none updated in EMR ?Notable signs/symptoms: knee pain, migraines  ?Any previous deficiencies? No ?  ?Lifestyle & Dietary Hx ? ?Pt states she cannot tolerate chicken and cannot do a lot of fish.   ? 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 ?24-Hr Dietary Recall ?First Meal: protein shake ?Snack:  cheese  ?Second Meal: soup + protein powder  ?Snack:  sugar free popsicle ?Third Meal: baked fish ?Snack: sugar free jello ?Beverages: water, water + flavoring ? ?Post-Op Goals/ Signs/ Symptoms ?Using straws: no ?Drinking while eating: no ?Chewing/swallowing difficulties: no ?Changes in vision: no ?Changes to mood/headaches: no ?Hair loss/changes to skin/nails: no ?Difficulty focusing/concentrating: no ?Sweating: no ?Limb weakness: no ?Dizziness/lightheadedness: no ?Palpitations: no  ?Carbonated/caffeinated beverages: no ?N/V/D/C/Gas: no ?Abdominal pain: no ?Dumping syndrome: no ? ?  ?NUTRITION DIAGNOSIS  ?Overweight/obesity ([place]-3.3) related to past poor dietary habits and physical inactivity as evidenced by completed bariatric surgery and following dietary guidelines for  continued weight loss and healthy nutrition status. ?  ?  ?NUTRITION INTERVENTION ?Nutrition counseling (C-1) and education (E-2) to facilitate bariatric surgery goals, including: ?Diet advancement to the next phase (phase non starchy vegetable) now including 4 ?The importance of consuming adequate calories as well as certain nutrients daily due to the body's need for essential vitamins, minerals, and fats ?The importance of daily physical activity and to reach a goal of at least 150 minutes of moderate to vigorous physical activity weekly (or as directed by their physician) due to benefits such as increased musculature and improved lab values ?The importance of intuitive eating specifically learning hunger-satiety cues and understanding the importance of learning a new body: The importance of mindful eating to avoid grazing behaviors  ? ?Goals: ?-Add in light weights 2 times a week ?-Continue to aim for a minimum of 64 fluid ounces 7 days a week with at least 30 ounces being plain water ? ?-Eat non-starchy vegetables 2 times a day 7 days a week ? ?-Start out with soft cooked vegetables today and tomorrow; if tolerated begin to eat raw vegetables or cooked including salads ? ?-Eat your 3 ounces of protein first then start in on your non-starchy vegetables; once you understand how much of your meal leads to satisfaction and not full while still eating 3 ounces of protein and non-starchy vegetables you can eat them in any order  ? ?-Continue  to aim for 30 minutes of activity at least 5 times a week ? ?-Do NOT cook with/add to your food: alfredo sauce, cheese sauce, barbeque sauce, ketchup, fat back, butter, bacon grease, grease, Crisco, OR SUGAR ? ? ?Handouts Provided Include  ?Phase 4 + fruit ? ?Learning Style & Readiness for Change ?Teaching method utilized: Visual & Auditory  ?Demonstrated degree of understanding via: Teach Back  ?Readiness Level: action  ?Barriers to learning/adherence to lifestyle change: none  identified  ? ?RD's Notes for Next Visit ?Assess adherence to pt chosen goals  ? ? ?MONITORING & EVALUATION ?Dietary intake, weekly physical activity, body weight ? ?Next Steps ?Patient is to follow-up in 3 months ?

## 2021-11-20 ENCOUNTER — Encounter: Payer: 59 | Attending: Surgery | Admitting: Skilled Nursing Facility1

## 2021-11-20 ENCOUNTER — Encounter: Payer: Self-pay | Admitting: Skilled Nursing Facility1

## 2021-11-20 DIAGNOSIS — Z6835 Body mass index (BMI) 35.0-35.9, adult: Secondary | ICD-10-CM | POA: Insufficient documentation

## 2021-11-20 DIAGNOSIS — Z713 Dietary counseling and surveillance: Secondary | ICD-10-CM | POA: Diagnosis not present

## 2021-11-20 NOTE — Progress Notes (Signed)
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 Start weight at [facility]: 246.4 Weight today: 196.8 pounds   Body Composition Scale 07/09/2021 08/20/2021 11/20/2021  Current Body Weight 231.5 213.8 196.8  Total Body Fat % 44.6 42.5 40.4  Visceral Fat '16 14 12  '$ Fat-Free Mass % 55.3 57.4 59.5   Total Body Water % 42.1 43.2 44.2  Muscle-Mass lbs 30.2 30 29.7  BMI 39.6 36.5 33.6  Body Fat Displacement            Torso  lbs 64 56.3 49.2         Left Leg  lbs 12.8 11.2 9.8         Right Leg  lbs 12.8 11.2 9.8         Left Arm  lbs 6.4 5.6 4.9         Right Arm   lbs 6.4 5.6 4.9   Clinical  Medical hx: HTN, arthritis  Medications: HTZ: cut back to one blood pressure pill  Labs: none updated in EMR Notable signs/symptoms: knee pain, migraines  Any previous deficiencies? No   Lifestyle & Dietary Hx   Pt states she has not energy and feels she is dragging having started about the last 6 weeks. Pt states she sleeps really well.   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   Pt states she already reached her goal and is not interested in losing anymore weight.   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   24-Hr Dietary Recall First Meal: protein shake or banana Snack 10am: 6 grapes Second Meal: chicken and broccoli Snack:  sugar free popsicle Third Meal: baked chicken and broccoli Snack: 100 calorie pack almonds Beverages: water, water + flavoring  Post-Op Goals/ Signs/ Symptoms Using straws: no Drinking while eating: no Chewing/swallowing difficulties: no Changes in vision: no Changes to mood/headaches: no Hair loss/changes to skin/nails: no Difficulty  focusing/concentrating: no Sweating: no Limb weakness: no Dizziness/lightheadedness: no Palpitations: no  Carbonated/caffeinated beverages: no N/V/D/C/Gas: no Abdominal pain: no Dumping syndrome: no    NUTRITION DIAGNOSIS  Overweight/obesity ([place]-3.3) related to past poor dietary habits and physical inactivity as evidenced by completed bariatric surgery and following dietary guidelines for continued weight loss and healthy nutrition status.     NUTRITION INTERVENTION Nutrition counseling (C-1) and education (E-2) to facilitate bariatric surgery goals, including: The importance of consuming adequate calories as well as certain nutrients daily due to the body's need for essential vitamins, minerals, and fats The importance of daily physical activity and to reach a goal of at least 150 minutes of moderate to vigorous physical activity weekly (or as directed by their physician) due to benefits such as increased musculature and improved lab values The importance of intuitive eating specifically learning hunger-satiety cues and understanding the importance of learning a new body: The importance of mindful eating to avoid grazing behaviors  Encouraged patient to honor their body's internal hunger and fullness cues.  Throughout the day, check in mentally and rate hunger. Stop eating when satisfied not full regardless of how much food is left on the plate.  Get more if still hungry 20-30 minutes later.  The key is to honor satisfaction so throughout  the meal, rate fullness factor and stop when comfortably satisfied not physically full. The key is to honor hunger and fullness without any feelings of guilt or shame.  Pay attention to what the internal cues are, rather than any external factors. This will enhance the confidence you have in listening to your own body and following those internal cues enabling you to increase how often you eat when you are hungry not out of appetite and stop when you are  satisfied not full.  Encouraged pt to continue to eat balanced meals inclusive of non starchy vegetables 2 times a day 7 days a week Encouraged pt to choose lean protein sources: limiting beef, pork, sausage, hotdogs, and lunch meat Encourage pt to choose healthy fats such as plant based limiting animal fats Encouraged pt to continue to drink a minium 64 fluid ounces with half being plain water to satisfy proper hydration   Goals: -Have the banana WITH the protein shake -include a complex carb with lunch and dinner  Handouts Provided Include    Learning Style & Readiness for Change Teaching method utilized: Visual & Auditory  Demonstrated degree of understanding via: Teach Back  Readiness Level: action  Barriers to learning/adherence to lifestyle change: none identified   RD's Notes for Next Visit Assess adherence to pt chosen goals    MONITORING & EVALUATION Dietary intake, weekly physical activity, body weight  Next Steps Patient is to follow-up in September

## 2022-01-27 ENCOUNTER — Encounter: Payer: Self-pay | Admitting: Skilled Nursing Facility1

## 2022-01-27 ENCOUNTER — Encounter: Payer: 59 | Attending: Surgery | Admitting: Skilled Nursing Facility1

## 2022-01-27 DIAGNOSIS — E669 Obesity, unspecified: Secondary | ICD-10-CM | POA: Insufficient documentation

## 2022-01-27 NOTE — Progress Notes (Signed)
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 Start weight at [facility]: 246.4 Weight today: 190.3 pounds   Body Composition Scale 07/09/2021 08/20/2021 11/20/2021 01/27/2022  Current Body Weight 231.5 213.8 196.8 190.3  Total Body Fat % 44.6 42.5 40.4 39.4  Visceral Fat '16 14 12 12  '$ Fat-Free Mass % 55.3 57.4 59.5 60.5   Total Body Water % 42.1 43.2 44.2 44.7  Muscle-Mass lbs 30.2 30 29.7 29.7  BMI 39.6 36.5 33.6 32.5  Body Fat Displacement             Torso  lbs 64 56.3 49.2 46.3         Left Leg  lbs 12.8 11.2 9.8 9.2         Right Leg  lbs 12.8 11.2 9.8 9.2         Left Arm  lbs 6.4 5.6 4.9 4.6         Right Arm   lbs 6.4 5.6 4.9 4.6   Clinical  Medical hx: HTN, arthritis  Medications: HTZ: cut back to one blood pressure pill  Labs: none updated in EMR Notable signs/symptoms: knee pain, migraines  Any previous deficiencies? No   Lifestyle & Dietary Hx   Pt states she has not energy and feels she is dragging having started about the last 6 weeks. Pt states she sleeps really well.   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   Pt states she already reached her goal and is not interested in losing anymore weight.   Pt state she was just on 2 vacations and is excited she did not gain weight.    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, resistance bands   24-Hr Dietary Recall First Meal: protein shake + banana Snack 10am: 6 grapes Second Meal: chicken and broccoli or salad or salmon + broccoli Snack:  nut pack Third Meal: salad + chicken Snack: protein shake sometimes Beverages: water, water + flavoring  Post-Op Goals/ Signs/ Symptoms Using straws:  no Drinking while eating: no Chewing/swallowing difficulties: no Changes in vision: no Changes to mood/headaches: no Hair loss/changes to skin/nails: no Difficulty focusing/concentrating: no Sweating: no Limb weakness: no Dizziness/lightheadedness: no Palpitations: no  Carbonated/caffeinated beverages: no N/V/D/C/Gas: no Abdominal pain: no Dumping syndrome: no    NUTRITION DIAGNOSIS  Overweight/obesity ([place]-3.3) related to past poor dietary habits and physical inactivity as evidenced by completed bariatric surgery and following dietary guidelines for continued weight loss and healthy nutrition status.     NUTRITION INTERVENTION Nutrition counseling (C-1) and education (E-2) to facilitate bariatric surgery goals, including: The importance of consuming adequate calories as well as certain nutrients daily due to the body's need for essential vitamins, minerals, and fats The importance of daily physical activity and to reach a goal of at least 150 minutes of moderate to vigorous physical activity weekly (or as directed by their physician) due to benefits such as increased musculature and improved lab values The importance of intuitive eating specifically learning hunger-satiety cues and understanding the importance of learning a new body: The importance of mindful eating to avoid grazing behaviors  Encouraged patient to honor their body's internal hunger and fullness cues.  Throughout the day, check in  mentally and rate hunger. Stop eating when satisfied not full regardless of how much food is left on the plate.  Get more if still hungry 20-30 minutes later.  The key is to honor satisfaction so throughout the meal, rate fullness factor and stop when comfortably satisfied not physically full. The key is to honor hunger and fullness without any feelings of guilt or shame.  Pay attention to what the internal cues are, rather than any external factors. This will enhance the confidence you have in  listening to your own body and following those internal cues enabling you to increase how often you eat when you are hungry not out of appetite and stop when you are satisfied not full.  Encouraged pt to continue to eat balanced meals inclusive of non starchy vegetables 2 times a day 7 days a week Encouraged pt to choose lean protein sources: limiting beef, pork, sausage, hotdogs, and lunch meat Encourage pt to choose healthy fats such as plant based limiting animal fats Encouraged pt to continue to drink a minium 64 fluid ounces with half being plain water to satisfy proper hydration   Goals: Great job, keep it up! Use your leg bands with your arms too  Handouts Provided Include    Learning Style & Readiness for Change Teaching method utilized: Visual & Auditory  Demonstrated degree of understanding via: Teach Back  Readiness Level: action  Barriers to learning/adherence to lifestyle change: none identified   RD's Notes for Next Visit Assess adherence to pt chosen goals    MONITORING & EVALUATION Dietary intake, weekly physical activity, body weight  Next Steps Patient is to follow-up

## 2022-02-13 ENCOUNTER — Other Ambulatory Visit: Payer: Self-pay | Admitting: Specialist

## 2022-02-13 DIAGNOSIS — R42 Dizziness and giddiness: Secondary | ICD-10-CM

## 2022-02-13 DIAGNOSIS — G43709 Chronic migraine without aura, not intractable, without status migrainosus: Secondary | ICD-10-CM

## 2022-02-28 ENCOUNTER — Ambulatory Visit
Admission: RE | Admit: 2022-02-28 | Discharge: 2022-02-28 | Disposition: A | Discharge status: Home / Self Care | Payer: 59 | Source: Ambulatory Visit | Attending: Specialist | Admitting: Specialist

## 2022-02-28 DIAGNOSIS — R42 Dizziness and giddiness: Secondary | ICD-10-CM

## 2022-02-28 DIAGNOSIS — G43709 Chronic migraine without aura, not intractable, without status migrainosus: Secondary | ICD-10-CM

## 2022-07-01 ENCOUNTER — Encounter: Payer: Self-pay | Admitting: Skilled Nursing Facility1

## 2022-07-01 ENCOUNTER — Encounter: Payer: 59 | Attending: Surgery | Admitting: Skilled Nursing Facility1

## 2022-07-01 VITALS — Ht 64.0 in | Wt 185.5 lb

## 2022-07-01 DIAGNOSIS — E669 Obesity, unspecified: Secondary | ICD-10-CM | POA: Diagnosis present

## 2022-07-01 NOTE — Progress Notes (Signed)
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 Start weight at [facility]: 246.4 Weight today: 185.5 pounds   Body Composition Scale 07/09/2021 08/20/2021 11/20/2021 01/27/2022 07/01/2022  Current Body Weight 231.5 213.8 196.8 190.3 185.5  Total Body Fat % 44.6 42.5 40.4 39.4 38.6  Visceral Fat 16 14 12 12 11  $ Fat-Free Mass % 55.3 57.4 59.5 60.5 61.3   Total Body Water % 42.1 43.2 44.2 44.7 45.1  Muscle-Mass lbs 30.2 30 29.7 29.7 29.6  BMI 39.6 36.5 33.6 32.5 31.7  Body Fat Displacement              Torso  lbs 64 56.3 49.2 46.3 44.2         Left Leg  lbs 12.8 11.2 9.8 9.2 8.8         Right Leg  lbs 12.8 11.2 9.8 9.2 8.8         Left Arm  lbs 6.4 5.6 4.9 4.6 4.4         Right Arm   lbs 6.4 5.6 4.9 4.6 4.4   Clinical  Medical hx: HTN, arthritis  Medications: HTZ: cut back to one blood pressure pill  Labs: none updated in EMR Notable signs/symptoms: knee pain, migraines  Any previous deficiencies? No   Lifestyle & Dietary Hx  Pt states she restarted Topamax due to nagging headaches that she wakes up with and lingers.    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, resistance bands and kettle bell    24-Hr Dietary Recall: fruit cup 2 times a week; pinto 2 times a week First Meal: protein shake + banana and grapes and boiled egg Snack 10am: nuts Second Meal: chicken and broccoli or salad + crackers or salmon + broccoli or green beans + tilapia Snack:  nut pack Third Meal: chicken and broccoli or salad or salmon + broccoli or green beans + tilapia Snack:  Beverages: water  Post-Op Goals/ Signs/ Symptoms Using straws: no Drinking while eating: no Chewing/swallowing difficulties:  no Changes in vision: no Changes to mood/headaches: no Hair loss/changes to skin/nails: no Difficulty focusing/concentrating: no Sweating: no Limb weakness: no Dizziness/lightheadedness: no Palpitations: no  Carbonated/caffeinated beverages: no N/V/D/C/Gas: no Abdominal pain: no Dumping syndrome: no    NUTRITION DIAGNOSIS  Overweight/obesity ([place]-3.3) related to past poor dietary habits and physical inactivity as evidenced by completed bariatric surgery and following dietary guidelines for continued weight loss and healthy nutrition status.     NUTRITION INTERVENTION Nutrition counseling (C-1) and education (E-2) to facilitate bariatric surgery goals, including: The importance of consuming adequate calories as well as certain nutrients daily due to the body's need for essential vitamins, minerals, and fats The importance of daily physical activity and to reach a goal of at least 150 minutes of moderate to vigorous physical activity weekly (or as directed by their physician) due to benefits such as increased musculature and improved lab values The importance of intuitive eating specifically learning hunger-satiety cues and understanding the importance of learning a new body: The importance of mindful eating to avoid grazing behaviors  Encouraged patient to honor their body's internal hunger and fullness cues.  Throughout the day, check in mentally and rate hunger. Stop eating when satisfied not  full regardless of how much food is left on the plate.  Get more if still hungry 20-30 minutes later.  The key is to honor satisfaction so throughout the meal, rate fullness factor and stop when comfortably satisfied not physically full. The key is to honor hunger and fullness without any feelings of guilt or shame.  Pay attention to what the internal cues are, rather than any external factors. This will enhance the confidence you have in listening to your own body and following those internal cues  enabling you to increase how often you eat when you are hungry not out of appetite and stop when you are satisfied not full.  Encouraged pt to continue to eat balanced meals inclusive of non starchy vegetables 2 times a day 7 days a week Encouraged pt to choose lean protein sources: limiting beef, pork, sausage, hotdogs, and lunch meat Encourage pt to choose healthy fats such as plant based limiting animal fats Encouraged pt to continue to drink a minium 64 fluid ounces with half being plain water to satisfy proper hydration  Pt is encouraged to consume less processed foods, as they have the potential to negatively affect overall health. Processed foods often destroy or remove nutrients from the product and/or have added salt, sugar, and saturated fats. Although not all processed foods are a concern, it is advised to have the majority of our foods be unprocessed or minimally processed as opposed to processed and ultra-processed. An example of this would be a whole apple (unprocessed), prepackaged apple slices with no additives (minimally processed), unsweetened applesauce (processed), and sweetened applesauce or apple juice with high fructose corn syrup (ultra-processed). For further information please visit [email or web address].   Goals: Add in complex carbohydrates 3 servings per day minimum  Add in another day of activity  Work on getting a hobby Try [employer]isc[employer] instead of [employer] Provided Include  1 year handout  Learning Style & Readiness for Change Teaching method utilized: Environmental health practitioner & Auditory  Demonstrated degree of understanding via: Teach Back  Readiness Level: action  Barriers to learning/adherence to lifestyle change: none identified   RD's Notes for Next Visit Assess adherence to pt chosen goals    MONITORING & EVALUATION Dietary intake, weekly physical activity, body weight  Next Steps Patient is to follow-up in 4  months

## 2022-07-16 ENCOUNTER — Ambulatory Visit: Payer: 59 | Admitting: Plastic Surgery

## 2022-07-16 ENCOUNTER — Encounter: Payer: Self-pay | Admitting: Plastic Surgery

## 2022-07-16 VITALS — BP 114/78 | HR 84 | Ht 64.0 in | Wt 186.2 lb

## 2022-07-16 DIAGNOSIS — Z9884 Bariatric surgery status: Secondary | ICD-10-CM

## 2022-07-16 DIAGNOSIS — R21 Rash and other nonspecific skin eruption: Secondary | ICD-10-CM

## 2022-07-16 DIAGNOSIS — M793 Panniculitis, unspecified: Secondary | ICD-10-CM | POA: Diagnosis not present

## 2022-07-16 NOTE — Progress Notes (Signed)
Referring Provider Tisovec, Fransico Him, MD [address] [place],  [place] [address]   CC:  Chief Complaint  Patient presents with   Advice Only      Tamara Malone is an 57 y.o. female.  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  She has had over an 80 pound weight loss during that period of time.  She has also been experiencing rashes on the posterior aspect of her pannus and is now requesting removal of the pannus as nystatin powder is no longer working consistently.  No Known Allergies  Outpatient Encounter Medications as of 07/16/2022  Medication Sig   baclofen (LIORESAL) 10 MG tablet Take 10 mg by mouth 2 (two) times daily as needed.   hydrochlorothiazide (HYDRODIURIL) 12.5 MG tablet Take 12.5 mg by mouth 2 (two) times daily.    Multiple Vitamin (MULTI-VITAMIN) tablet Take 1 tablet by mouth daily.   nystatin (MYCOSTATIN/NYSTOP) powder SMARTSIG:Packet(s) Topical Twice Daily   tretinoin (RETIN-A) 0.025 % cream Apply topically at bedtime.   zonisamide (ZONEGRAN) 50 MG capsule Take 150 mg by mouth daily.   [DISCONTINUED] gabapentin (NEURONTIN) 100 MG capsule Take 2 capsules (200 mg total) by mouth every 12 (twelve) hours.   [DISCONTINUED] ondansetron (ZOFRAN-ODT) 4 MG disintegrating tablet Dissolve 1 tablet (4 mg total) by mouth every 6 (six) hours as needed for nausea or vomiting.   [DISCONTINUED] oxyCODONE (OXY IR/ROXICODONE) 5 MG immediate release tablet Take 1 tablet (5 mg total) by mouth every 6 (six) hours as needed for severe pain.   [DISCONTINUED] pantoprazole (PROTONIX) 40 MG tablet Take 1 tablet (40 mg total) by mouth daily.   No facility-administered encounter medications on file as of 07/16/2022.     Past Medical History:  Diagnosis Date   Arthritis    Hypertension    Migraines    once very couple months    Obesity    Primary localized osteoarthritis of left knee 02/01/2019    Past Surgical History:   Procedure Laterality Date   ABDOMINAL HYSTERECTOMY     BIOPSY  02/26/2021   Procedure: BIOPSY;  Surgeon: Felicie Morn, MD;  Location: [facility];  Service: General;;   ESOPHAGOGASTRODUODENOSCOPY N/A 02/26/2021   Procedure: ESOPHAGOGASTRODUODENOSCOPY (EGD);  Surgeon: Felicie Morn, MD;  Location: [facility];  Service: General;  Laterality: N/A;   HIATAL HERNIA REPAIR N/A 06/24/2021   Procedure: HERNIA REPAIR HIATAL;  Surgeon: Felicie Morn, MD;  Location: [facility];  Service: General;  Laterality: N/A;   PARTIAL KNEE ARTHROPLASTY Left 02/01/2019   Procedure: UNICOMPARTMENTAL LEFT KNEE;  Surgeon: Marchia Bond, MD;  Location: [facility];  Service: Orthopedics;  Laterality: Left;    Family History  Problem Relation Age of Onset   Hypertension Mother    Diabetes Mother    Diabetes Sister    Hypertension Brother    Diabetes Brother    Breast cancer Maternal Aunt    Heart disease Maternal Grandmother    Hypertension Other    Hyperlipidemia Other    Diabetes Other    Stroke Other    Heart disease Other    Colon cancer Neg Hx    Sleep apnea Neg Hx     Social History   Social History Narrative   Not on file     Review of Systems General: Denies fevers, chills, weight loss CV: Denies chest pain, shortness of breath, palpitations Skin: Patient has significant apron of skin on the  anterior abdominal wall.  She reports repeated rashes on the posterior aspect of the pannus and in the intertriginous regions.  Physical Exam    07/16/2022    1:57 PM 07/01/2022    7:34 AM 01/27/2022    9:26 AM  Vitals with BMI  Height '5\' 4"'$  '5\' 4"'$  '5\' 4"'$   Weight 186 lbs 3 oz 185 lbs 8 oz 190 lbs 5 oz  BMI 31.95 99991111 [id number]  Systolic [id number]    Diastolic 78    Pulse 84      General:  No acute distress,  Alert and oriented, Non-Toxic, Normal speech and affect Integument: Patient has a pannus which hangs below her symphysis pubis.  She does not have any rashes today but there is scarring  consistent with ongoing rashes. Mammogram: Not applicable Assessment/Plan Panniculitis: The patient is a good candidate for a panniculectomy which I believe would benefit her by decreasing the number of rashes that she has.  She would also be a good candidate for an abdominoplasty but she is not interested in this at this time.  We discussed the procedure at length including the location of the incisions and the unpredictable nature of scarring.  She understands that I will use drains postoperatively.  She understands that she will need to wear compression for 6 weeks postoperatively.  She understands her physical limitations postoperatively will include no heavy lifting greater than 20 pounds, no vigorous activity, no submerging the incisions in water for 6 weeks.  She may return to light activity as soon as she feels comfortable.  All questions were answered to her satisfaction.  Photographs were obtained with her consent.  Will schedule for panniculectomy at her request.  Camillia Herter 07/16/2022, 3:12 PM

## 2022-08-07 ENCOUNTER — Telehealth: Payer: Self-pay | Admitting: Plastic Surgery

## 2022-08-07 NOTE — Telephone Encounter (Signed)
Pending Ref# [id number] all clinicals uploaded into [facility] Portal.

## 2022-08-08 ENCOUNTER — Telehealth: Payer: Self-pay | Admitting: Plastic Surgery

## 2022-08-08 NOTE — Telephone Encounter (Signed)
Notified pt of insurance denial for panniculectomy procedure and appeal rights.  Gave pt number for [facility] to appeal decision which she wants to do.  She will keep [place] updated.

## 2022-08-22 ENCOUNTER — Telehealth: Payer: Self-pay | Admitting: Plastic Surgery

## 2022-08-22 NOTE — Telephone Encounter (Signed)
Appeal started submission ID#[id number]

## 2022-10-28 ENCOUNTER — Ambulatory Visit: Payer: 59 | Admitting: Skilled Nursing Facility1

## 2022-12-22 ENCOUNTER — Encounter: Payer: Self-pay | Admitting: Skilled Nursing Facility1

## 2022-12-22 ENCOUNTER — Encounter: Payer: 59 | Attending: Surgery | Admitting: Skilled Nursing Facility1

## 2022-12-22 VITALS — Ht 64.0 in | Wt 187.6 lb

## 2022-12-22 DIAGNOSIS — E669 Obesity, unspecified: Secondary | ICD-10-CM | POA: Insufficient documentation

## 2022-12-22 NOTE — Progress Notes (Signed)
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 Start weight at [facility]: 246.4 Weight today: 187.6 pounds   Body Composition Scale 08/20/2021 11/20/2021 01/27/2022 07/01/2022 12/22/2022  Current Body Weight 213.8 196.8 190.3 185.5 187.6  Total Body Fat % 42.5 40.4 39.4 38.6 39.1  Visceral Fat 14 12 12 11 12   Fat-Free Mass % 57.4 59.5 60.5 61.3 60.8   Total Body Water % 43.2 44.2 44.7 45.1 44.9  Muscle-Mass lbs 30 29.7 29.7 29.6 29.5  BMI 36.5 33.6 32.5 31.7 32  Body Fat Displacement              Torso  lbs 56.3 49.2 46.3 44.2 45.4         Left Leg  lbs 11.2 9.8 9.2 8.8 9         Right Leg  lbs 11.2 9.8 9.2 8.8 9         Left Arm  lbs 5.6 4.9 4.6 4.4 4.5         Right Arm   lbs 5.6 4.9 4.6 4.4 4.5   Clinical  Medical hx: HTN, arthritis  Medications: HTZ: cut back to one blood pressure pill  Labs: none updated in EMR Notable signs/symptoms: knee pain, migraines  Any previous deficiencies? No   Lifestyle & Dietary Hx  Pt states her headaches have not been too bad.  Pt states her activity is not the greatest.   Estimated daily fluid intake: 84 oz Estimated daily protein intake: 70 g Supplements: multi and calcium  Current average weekly physical activity: line dancing once a week, chair exercise 2 times a week, walk away the pounds (incorporates hand weights)  24-Hr Dietary Recall: fruit cup 2 times a week; pinto 2 times a week First Meal: protein shake + banana and cherries Snack 10am:  Second Meal: chicken and broccoli or salad + crackers or salmon + broccoli or green beans + tilapia Snack:  nut pack Third Meal: chicken and broccoli or salad or salmon + broccoli or green beans + tilapia Snack:  Beverages: water, water + mio  Post-Op Goals/ Signs/ Symptoms Using straws: no Drinking while eating: no Chewing/swallowing difficulties: no Changes in vision: no Changes to mood/headaches:  no Hair loss/changes to skin/nails: no Difficulty focusing/concentrating: no Sweating: no Limb weakness: no Dizziness/lightheadedness: no Palpitations: no  Carbonated/caffeinated beverages: no N/V/D/C/Gas: no Abdominal pain: no Dumping syndrome: no    NUTRITION DIAGNOSIS  Overweight/obesity ([place]-3.3) related to past poor dietary habits and physical inactivity as evidenced by completed bariatric surgery and following dietary guidelines for continued weight loss and healthy nutrition status.     NUTRITION INTERVENTION Nutrition counseling (C-1) and education (E-2) to facilitate bariatric surgery goals, including: The importance of consuming adequate calories as well as certain nutrients daily due to the body's need for essential vitamins, minerals, and fats The importance of daily physical activity and to reach a goal of at least 150 minutes of moderate to vigorous physical activity weekly (or as directed by their physician) due to benefits such as increased musculature and improved lab values The importance of intuitive eating specifically learning hunger-satiety cues and understanding the importance of learning a new body: The importance of mindful eating to avoid grazing behaviors  Encouraged patient to honor their body's internal hunger and fullness cues.  Throughout the day, check in mentally and rate hunger. Stop eating when satisfied not full regardless of how much food is left on  the plate.  Get more if still hungry 20-30 minutes later.  The key is to honor satisfaction so throughout the meal, rate fullness factor and stop when comfortably satisfied not physically full. The key is to honor hunger and fullness without any feelings of guilt or shame.  Pay attention to what the internal cues are, rather than any external factors. This will enhance the confidence you have in listening to your own body and following those internal cues enabling you to increase how often you eat when you are  hungry not out of appetite and stop when you are satisfied not full.  Encouraged pt to continue to eat balanced meals inclusive of non starchy vegetables 2 times a day 7 days a week Encouraged pt to choose lean protein sources: limiting beef, pork, sausage, hotdogs, and lunch meat Encourage pt to choose healthy fats such as plant based limiting animal fats Encouraged pt to continue to drink a minium 64 fluid ounces with half being plain water to satisfy proper hydration  Why you need complex carbohydrates: Whole grains and other complex carbohydrates are required to have a healthy diet. Whole grains provide fiber which can help with blood glucose levels and help keep you satiated. Fruits and starchy vegetables provide essential vitamins and minerals required for immune function, eyesight support, brain support, bone density, wound healing and many other functions within the body. According to the current evidenced based 2020-2025 Dietary Guidelines for Americans, complex carbohydrates are part of a healthy eating pattern which is associated with a decreased risk for type 2 diabetes, cancers, and cardiovascular disease.    Goals: Be sure to add at last one more carb by the end of the day such as beans  Handouts Previously Provided Include  1 year handout  Learning Style & Readiness for Change Teaching method utilized: Visual & Auditory  Demonstrated degree of understanding via: Teach Back  Readiness Level: action  Barriers to learning/adherence to lifestyle change: none identified   RD's Notes for Next Visit Assess adherence to pt chosen goals    MONITORING & EVALUATION Dietary intake, weekly physical activity, body weight  Next Steps Patient is to follow-up in  February

## 2023-06-22 ENCOUNTER — Encounter: Payer: 59 | Attending: Surgery | Admitting: Skilled Nursing Facility1

## 2023-06-22 ENCOUNTER — Encounter: Payer: Self-pay | Admitting: Skilled Nursing Facility1

## 2023-06-22 VITALS — Ht 64.0 in | Wt 184.0 lb

## 2023-06-22 DIAGNOSIS — Z6841 Body Mass Index (BMI) 40.0 and over, adult: Secondary | ICD-10-CM | POA: Insufficient documentation

## 2023-06-22 NOTE — Progress Notes (Signed)
Bariatric Nutrition Follow-Up Visit Medical Nutrition Therapy   Start time : 3:08pm  End time : 3:45pm NUTRITION ASSESSMENT    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 Start weight at [facility]: 246.4 Weight today: 184 pounds   Body Composition Scale 11/20/2021 01/27/2022 07/01/2022 12/22/2022 06/22/2023  Current Body Weight 196.8 190.3 185.5 187.6 184  Total Body Fat % 40.4 39.4 38.6 39.1 38.5  Visceral Fat 12 12 11 12 11   Fat-Free Mass % 59.5 60.5 61.3 60.8 61.4   Total Body Water % 44.2 44.7 45.1 44.9 45.2  Muscle-Mass lbs 29.7 29.7 29.6 29.5 29.5  BMI 33.6 32.5 31.7 32 31.4  Body Fat Displacement              Torso  lbs 49.2 46.3 44.2 45.4 43.7         Left Leg  lbs 9.8 9.2 8.8 9 8.7         Right Leg  lbs 9.8 9.2 8.8 9 8.7         Left Arm  lbs 4.9 4.6 4.4 4.5 4.3         Right Arm   lbs 4.9 4.6 4.4 4.5 4.3   Clinical  Medical hx: HTN, arthritis  Medications: HTZ: cut back to one blood pressure pill  Labs: none updated in EMR Notable signs/symptoms: knee pain, migraines  Any previous deficiencies? No   Lifestyle & Dietary Hx  Pt states her headaches have not been too bad.  Pt states her activity is not the greatest.  Pt states she has not had the best energy level.    Estimated daily fluid intake: 67 oz Estimated daily protein intake: 70 g Supplements: multi and calcium  Current average weekly physical activity: line dancing once a week, chair exercise 2 times a week, walk away the pounds (incorporates hand weights)  24-Hr Dietary Recall: fruit cup 2 times a week; pinto 2 times a week; pt states she gets her vegetables in real good throughout the week First Meal: protein shake + banana Snack 10am: cheese and grapes Second Meal: lean cuisine (every other day) or cajun [place] sandwich  Snack:  Third Meal: grilled chicken salad from [employer] or cup of [employer] broccoli and cheese soup Snack:  Beverages: water, water + mio  Post-Op Goals/ Signs/ Symptoms Using  straws: no Drinking while eating: no Chewing/swallowing difficulties: no Changes in vision: no Changes to mood/headaches: no Hair loss/changes to skin/nails: no Difficulty focusing/concentrating: no Sweating: no Limb weakness: no Dizziness/lightheadedness: no Palpitations: no  Carbonated/caffeinated beverages: no N/V/D/C/Gas: no Abdominal pain: no Dumping syndrome: no    NUTRITION DIAGNOSIS  Overweight/obesity ([place]-3.3) related to past poor dietary habits and physical inactivity as evidenced by completed bariatric surgery and following dietary guidelines for continued weight loss and healthy nutrition status.     NUTRITION INTERVENTION Nutrition counseling (C-1) and education (E-2) to facilitate bariatric surgery goals, including: The importance of consuming adequate calories as well as certain nutrients daily due to the body's need for essential vitamins, minerals, and fats The importance of daily physical activity and to reach a goal of at least 150 minutes of moderate to vigorous physical activity weekly (or as directed by their physician) due to benefits such as increased musculature and improved lab values The importance of intuitive eating specifically learning hunger-satiety cues and understanding the importance of learning a new body: The importance of mindful eating to avoid grazing behaviors  Encouraged patient to honor their body's internal hunger and fullness cues.  Throughout the day, check in mentally and rate hunger. Stop eating when satisfied not full regardless of how much food is left on the plate.  Get more if still hungry 20-30 minutes later.  The key is to honor satisfaction so throughout the meal, rate fullness factor and stop when comfortably satisfied not physically full. The key is to honor hunger and fullness without any feelings of guilt or shame.  Pay attention to what the internal cues are, rather than any external factors. This will enhance the confidence you  have in listening to your own body and following those internal cues enabling you to increase how often you eat when you are hungry not out of appetite and stop when you are satisfied not full.  Encouraged pt to continue to eat balanced meals inclusive of non starchy vegetables 2 times a day 7 days a week Encouraged pt to choose lean protein sources: limiting beef, pork, sausage, hotdogs, and lunch meat Encourage pt to choose healthy fats such as plant based limiting animal fats Encouraged pt to continue to drink a minium 64 fluid ounces with half being plain water to satisfy proper hydration  Why you need complex carbohydrates: Whole grains and other complex carbohydrates are required to have a healthy diet. Whole grains provide fiber which can help with blood glucose levels and help keep you satiated. Fruits and starchy vegetables provide essential vitamins and minerals required for immune function, eyesight support, brain support, bone density, wound healing and many other functions within the body. According to the current evidenced based 2020-2025 Dietary Guidelines for Americans, complex carbohydrates are part of a healthy eating pattern which is associated with a decreased risk for type 2 diabetes, cancers, and cardiovascular disease.   Handouts Previously Provided Include  1 year handout  Learning Style & Readiness for Change Teaching method utilized: Visual & Auditory  Demonstrated degree of understanding via: Teach Back  Readiness Level: action  Barriers to learning/adherence to lifestyle change: none identified   RD's Notes for Next Visit Assess adherence to pt chosen goals   MONITORING & EVALUATION Dietary intake, weekly physical activity, body weight  Next Steps Patient does not desire follow-up at this time ,but states she'll follow up with questions and concerns

## 2024-01-19 ENCOUNTER — Encounter (HOSPITAL_COMMUNITY): Payer: Self-pay | Admitting: *Deleted

## 2024-03-22 DIAGNOSIS — Z0289 Encounter for other administrative examinations: Secondary | ICD-10-CM

## 2024-03-22 NOTE — Progress Notes (Unsigned)
 Office: [phone]  /  Fax: [phone]   Initial Visit    Tamara Malone was seen in clinic today to evaluate for obesity. She is interested in losing weight to improve overall health and reduce the risk of weight related complications. She presents today to review program treatment options, initial physical assessment, and evaluation.     She was referred by: {emreferby:28303}  When asked what else they would like to accomplish? She states: {EMHopetoaccomplish:28304::Adopt a healthier eating pattern and lifestyle,Improve energy levels and physical activity,Improve existing medical conditions,Improve quality of life}  When asked how has your weight affected you? 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 Preop weight 244, currently weighs 189 with BMI 32.58. Personal goal weight of 160 to 170 pounds   Some associated conditions: {EMSomeConditions:28306}  Contributing factors: {EMcontributingfactors:28307}  Weight promoting medications identified: {EMWeightpromotingrx:28308}  Prior weight loss attempts: {emweightlossprograms:31590::None}  Current nutrition plan: {EMNutritionplan:28309::None}  Current level of physical activity: {EMcurrentPA:28310::None}  Current or previous pharmacotherapy: {EM previousRx:28311}  Response to medication: {EMResponsetomedication:28312}   Past medical history includes:   Past Medical History:  Diagnosis Date   Arthritis    Hypertension    Migraines    once very couple months    Obesity    Primary localized osteoarthritis of left knee 02/01/2019     Objective    There were no vitals taken for this visit. She was weighed on the bioimpedance scale: There is no height or weight on file to calculate BMI.  Body Fat%:***, Visceral Fat Rating:***, Weight trend over the last 12 months: {emweighttrend:28333}  General:  Alert, oriented and  cooperative. Patient is in no acute distress.  Respiratory: Normal respiratory effort, no problems with respiration noted   Gait: able to ambulate independently  Mental Status: Normal mood and affect. Normal behavior. Normal judgment and thought content.   DIAGNOSTIC DATA REVIEWED:  BMET    Component Value Date/Time   NA 139 06/17/2021 1410   K 3.8 06/17/2021 1410   CL 101 06/17/2021 1410   CO2 26 06/17/2021 1410   GLUCOSE 86 06/17/2021 1410   BUN 27 (H) 06/17/2021 1410   CREATININE 0.84 06/24/2021 1316   CALCIUM 9.5 06/17/2021 1410   GFRNONAA >60 06/24/2021 1316   GFRAA >60 02/02/2019 0240   No results found for: HGBA1C No results found for: INSULIN CBC    Component Value Date/Time   WBC 7.8 06/25/2021 0444   RBC 4.08 06/25/2021 0444   HGB 11.1 (L) 06/25/2021 0444   HCT 34.3 (L) 06/25/2021 0444   PLT 319 06/25/2021 0444   MCV 84.1 06/25/2021 0444   MCH 27.2 06/25/2021 0444   MCHC 32.4 06/25/2021 0444   RDW 16.4 (H) 06/25/2021 0444   Iron/TIBC/Ferritin/ %Sat No results found for: IRON, TIBC, FERRITIN, IRONPCTSAT Lipid Panel  No results found for: CHOL, TRIG, HDL, CHOLHDL, VLDL, LDLCALC, LDLDIRECT Hepatic Function Panel     Component Value Date/Time   PROT 7.5 06/17/2021 1410   ALBUMIN 3.9 06/17/2021 1410   AST 23 06/17/2021 1410   ALT 25 06/17/2021 1410   ALKPHOS 73 06/17/2021 1410   BILITOT 0.1 (L) 06/17/2021 1410   No results found for: TSH   Assessment and Plan   There are no diagnoses linked to this encounter.  Assessment and Plan Assessment & Plan         Obesity Treatment / Action Plan:  {EMobesityactionplanscribe:28314::Patient will work on garnering support from family and  friends to begin weight loss journey.,Will work on eliminating or reducing the presence of highly palatable, calorie dense foods in the home.,Will complete provided nutritional and psychosocial assessment questionnaire before the next  appointment.,Will be scheduled for indirect calorimetry to determine resting energy expenditure in a fasting state.  This will allow us  to create a reduced calorie, high-protein meal plan to promote loss of fat mass while preserving muscle mass.,Counseled on the health benefits of losing 5%-15% of total body weight.,Was counseled on nutritional approaches to weight loss and benefits of reducing processed foods and consuming plant-based foods and high quality protein as part of nutritional weight management.,Was counseled on pharmacotherapy and role as an adjunct in weight management. }  Obesity Education Performed Today:  She was weighed on the bioimpedance scale and results were discussed and documented in the synopsis.  We discussed obesity as a disease and the importance of a more detailed evaluation of all the factors contributing to the disease.  We discussed the importance of long term lifestyle changes which include nutrition, exercise and behavioral modifications as well as the importance of customizing this to her specific health and social needs.  We discussed the benefits of reaching a healthier weight to alleviate the symptoms of existing conditions and reduce the risks of the biomechanical, metabolic and psychological effects of obesity.  We reviewed the four pillars of obesity medicine and importance of using a multimodal approach.  We reviewed the basic principles in weight management.   Tamara Malone appears to be in the action stage of change and states they are ready to start intensive lifestyle modifications and behavioral modifications.  I have spent *** minutes in the care of the patient today including: {NUMBER 1-10:22536} minutes before the visit reviewing and preparing the chart. *** minutes face-to-face {emfacetoface:32598::assessing and reviewing listed medical problems as outlined in obesity care plan,providing nutritional and behavioral counseling on  topics outlined in the obesity care plan,independently interpreting test results and goals of care, as described in assessment and plan,reviewing and discussing biometric information and progress} {NUMBER 1-10:22536} minutes after the visit updating chart and documentation of encounter.  Reviewed by clinician on day of visit: allergies, medications, problem list, medical history, surgical history, family history, social history, and previous encounter notes pertinent to obesity diagnosis.   Lucas Parker, MD

## 2024-03-23 ENCOUNTER — Ambulatory Visit (INDEPENDENT_AMBULATORY_CARE_PROVIDER_SITE_OTHER): Admitting: Physician Assistant

## 2024-03-23 ENCOUNTER — Encounter (INDEPENDENT_AMBULATORY_CARE_PROVIDER_SITE_OTHER): Payer: Self-pay | Admitting: Physician Assistant

## 2024-03-23 VITALS — BP 158/88 | HR 63 | Temp 98.2°F | Ht 63.0 in | Wt 187.0 lb

## 2024-03-23 DIAGNOSIS — Z6833 Body mass index (BMI) 33.0-33.9, adult: Secondary | ICD-10-CM

## 2024-03-23 DIAGNOSIS — I10 Essential (primary) hypertension: Secondary | ICD-10-CM

## 2024-03-23 DIAGNOSIS — N182 Chronic kidney disease, stage 2 (mild): Secondary | ICD-10-CM

## 2024-03-23 DIAGNOSIS — E66811 Obesity, class 1: Secondary | ICD-10-CM | POA: Diagnosis not present

## 2024-03-23 DIAGNOSIS — Z903 Acquired absence of stomach [part of]: Secondary | ICD-10-CM

## 2024-03-23 DIAGNOSIS — Z9884 Bariatric surgery status: Secondary | ICD-10-CM

## 2024-03-23 DIAGNOSIS — E7800 Pure hypercholesterolemia, unspecified: Secondary | ICD-10-CM

## 2024-03-23 DIAGNOSIS — K912 Postsurgical malabsorption, not elsewhere classified: Secondary | ICD-10-CM | POA: Diagnosis not present

## 2024-03-23 DIAGNOSIS — Z862 Personal history of diseases of the blood and blood-forming organs and certain disorders involving the immune mechanism: Secondary | ICD-10-CM

## 2024-03-23 DIAGNOSIS — Z96653 Presence of artificial knee joint, bilateral: Secondary | ICD-10-CM

## 2024-03-23 DIAGNOSIS — E6609 Other obesity due to excess calories: Secondary | ICD-10-CM

## 2024-04-06 ENCOUNTER — Encounter (INDEPENDENT_AMBULATORY_CARE_PROVIDER_SITE_OTHER): Payer: Self-pay | Admitting: Internal Medicine

## 2024-04-06 ENCOUNTER — Ambulatory Visit (INDEPENDENT_AMBULATORY_CARE_PROVIDER_SITE_OTHER): Admitting: Internal Medicine

## 2024-04-06 VITALS — BP 148/90 | HR 58 | Temp 97.8°F | Ht 63.0 in | Wt 188.0 lb

## 2024-04-06 DIAGNOSIS — E7800 Pure hypercholesterolemia, unspecified: Secondary | ICD-10-CM | POA: Diagnosis not present

## 2024-04-06 DIAGNOSIS — Z9884 Bariatric surgery status: Secondary | ICD-10-CM | POA: Insufficient documentation

## 2024-04-06 DIAGNOSIS — I10 Essential (primary) hypertension: Secondary | ICD-10-CM | POA: Insufficient documentation

## 2024-04-06 DIAGNOSIS — Z1331 Encounter for screening for depression: Secondary | ICD-10-CM | POA: Diagnosis not present

## 2024-04-06 DIAGNOSIS — R5383 Other fatigue: Secondary | ICD-10-CM

## 2024-04-06 DIAGNOSIS — R0602 Shortness of breath: Secondary | ICD-10-CM

## 2024-04-06 DIAGNOSIS — Z6833 Body mass index (BMI) 33.0-33.9, adult: Secondary | ICD-10-CM | POA: Diagnosis not present

## 2024-04-06 DIAGNOSIS — E66811 Obesity, class 1: Secondary | ICD-10-CM | POA: Diagnosis not present

## 2024-04-06 NOTE — Assessment & Plan Note (Signed)
 Per history.  We will check fasting lipid profile and assess cardiovascular risk.

## 2024-04-06 NOTE — Assessment & Plan Note (Signed)
 Intermittent elevated blood pressure readings, particularly in the morning. Previously on two antihypertensive pills, reduced to one due to low blood pressure after weight loss. Importance of morning blood pressure monitoring emphasized to prevent stroke risk. - Instructed to monitor blood pressure at home, especially in the morning before medication and before bedtime. - Advised to maintain blood pressure goal of less than 130/80 mmHg. - Losing 10% of body weight may reduce the risk

## 2024-04-06 NOTE — Assessment & Plan Note (Signed)
 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 Gradual weight regain likely due to metabolic changes and potential muscle loss post-surgery. Family history of obesity and genetic factors. Current lifestyle includes limited exercise due to work schedule and dietary habits with high starch intake. No prediabetes or fatty liver disease. Current metabolic rate is 8499 calories per day. - Created a 7-day meal plan with 1100 calories per day, high protein, and five small meals including three meals and two pre-meal protein snacks. - Encouraged use of AI tools like ChatGPT for meal planning and protein snack ideas. - Advised on increasing meal frequency to prevent metabolic slowdown. - Discussed potential use of GLP-1 agonists (Zepbound, Laura) for weight loss, pending insurance coverage. - Provided educational handouts on weight management principles and holiday eating strategies. - Ordered blood work including A1c, insulin, cholesterol, and thyroid levels. - Instructed to hold bariatric vitamins for three days before next visit to check vitamin levels.

## 2024-04-06 NOTE — Progress Notes (Signed)
 [address] [place],  [place], [place] [address]  Office: [phone]  /  Fax: [phone]   Subjective   Initial Visit  Tamara Malone (MR# [id number]) is a 58 y.o. female who presents for evaluation and treatment of obesity and related comorbidities. Current BMI is Body mass index is 33.3 kg/m. Tamara Malone has been struggling with her weight for many years and has been unsuccessful in either losing weight, maintaining weight loss, or reaching her healthy weight goal.  Tamara Malone is currently in the action stage of change and ready to dedicate time achieving and maintaining a healthier weight. Tamara Malone is interested in becoming our patient and working on intensive lifestyle modifications including (but not limited to) diet and exercise for weight loss.  Weight history:  She was referred by: Specialist- [facility]   When asked what else they would like to accomplish? She states: Adopt a healthier eating pattern and lifestyle, Improve energy levels and physical activity, Improve existing medical conditions, Improve quality of life, Improve appearance, Improve self-confidence, and Lose 20 lbs   When asked how has your weight affected you? She states: Has affected self-esteem, Contributed to medical problems, and Problems with eating patterns   Weight history: Tamara Malone is a 58 year old female who presents for an initial obesity treatment consultation. Weight gain after getting married.    She has a history of Roux-en-Y gastric bypass surgery, which reduced her weight from 244 pounds to 187 pounds. She has maintained this weight for about a year but feels she is not progressing towards her goal of reaching 160 to 170 pounds. She reports getting full quickly after eating small amounts.   Her highest recorded weight before the gastric bypass was 260 pounds. She has a personal goal to lose about 20 pounds from her current weight. She has tried Weight Watchers and used phentermine in the past, which  increased her energy but did not significantly aid in weight loss. She is not currently following any specific nutrition plan.   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 She is busy at work, contributing to her activity level, but does not engage in formal exercise. She sometimes skips breakfast.   She has a family history of obesity and reports occasional snoring as noted by her husband. She experiences hot flashes that interfere with her sleep and sometimes experiences stress. She occasionally craves certain foods and sometimes cooks at home on her days off.   No high cholesterol, liver disease, sleep apnea, diabetes, reflux, overactive bladder, polycystic ovary syndrome, heart disease, asthma, kidney disease, vitamin D deficiency, connective tissue disease, venous insufficiency, or blood clots. She reports knee replacements but no current orthopedic issues, no significant fatigue, and no mood disturbances.   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 Preop weight 244, currently weighs 189 with BMI 32.58. Personal goal weight of 160 to 170 pounds   Some associated conditions: Hypertension and Arthritis:S/P knee replacements   Contributing factors: family history of obesity, disruption of circadian rhythm / sleep disordered breathing, consumption of processed foods, moderate to high levels of stress, reduced physical activity, chronic skipping of meals, menopause, slow metabolism for age, enticing relationships and enviroment, history of metabolic surgery, multiple weight loss attempts in the past, and self - critic or all-or-none mindset   Weight promoting medications identified: None   Prior weight loss attempts: Weight Watchers   Current nutrition plan:  None   Current level of physical activity: NEAT   Current or previous pharmacotherapy: Is interested in  pharmacotherapy, Phentermine, and Other: zonisamide   Response to medication: medications have not been effective. Phentermine improved energy in past, but did not promote weight loss.   Discussed the use of AI scribe software for clinical note transcription with the patient, who gave verbal consent to proceed.  History of Present Illness Tamara Malone is a 58 year old female who presents for medical weight management. She was referred by a doctor from [facility] for weight management.  She underwent a Roux-en-Y gastric bypass surgery, initially weighing 244 pounds, and subsequently reduced her weight to 187 pounds, maintaining it for about a year. However, she has noticed a gradual weight regain. She denies a history of prediabetes, fatty liver disease, or sleep apnea.  Her work schedule is demanding, with 12-hour shifts. Her job requires her to be on her feet all day, but she has not tracked her steps. She eats out infrequently, usually on weekends, and enjoys French fries and potato chips. She typically skips breakfast but drinks a protein shake in the morning. She consumes regular soda and sweet tea occasionally on weekends and drinks orange juice twice a week.  She is currently taking a bariatric vitamin daily and topiramate  for migraines. She previously used phentermine for weight loss, which curbed her appetite and provided energy but caused anxiety and tightness, leading her to discontinue it after a year.  She has a history of high blood pressure, which was previously managed with two pills a day but was reduced to one pill due to low blood pressure after weight loss. She monitors her blood pressure occasionally and notes it is often high at doctor's visits but normalizes throughout the day. She did not take her medication on the morning of the visit.  No sleep problems, stating she wakes up in the middle of the night but can return to sleep easily. No issues with portion control,  stating she eats very little and sometimes feels she does not eat enough.    Nutritional History:  Current nutrition plan: None.  How many times do you eat outside the home: 1-2 per week  How often do they skip meals: does not skip meals and skips breakfast  What beverages do they drink: regular soda , juice, protein shakes , and sweet tea .   Use of artificial sweetners : No  Food intolerances or dislikes: none.  Food triggers: Stress, Seeking reward, and To help comfort self.  Food cravings: Sugary and Starches / Carbohydrates  Do they struggle with excessive hunger or portion control : No    Physical Activity:  Current level of physical activity: None  Barriers to Exercise: time   Past medical history includes:   Past Medical History:  Diagnosis Date   Arthritis    Hypertension    Migraines    once very couple months    Obesity    Primary localized osteoarthritis of left knee 02/01/2019     Objective   Pulse (!) 58   Temp 97.8 F (36.6 C)   Ht 5' 3 (1.6 m)   Wt 188 lb (85.3 kg)   SpO2 98%   BMI 33.30 kg/m  She was weighed on the bioimpedance scale: Body mass index is 33.3 kg/m.    Anthropometrics:  Vitals Temp: 97.8 F (36.6 C) Pulse Rate: (!) 58 SpO2: 98 %   Anthropometric Measurements Height: 5' 3 (1.6  m) Weight: 188 lb (85.3 kg) BMI (Calculated): 33.31 Starting Weight: 188 lb Peak Weight: 260 lb Waist Measurement : 34 inches   Body Composition  Body Fat %: 47.1 % Fat Mass (lbs): 88.8 lbs Muscle Mass (lbs): 94.6 lbs Visceral Fat Rating : 13   Other Clinical Data Fasting: yes Labs: yes Today's Visit #: 1 Starting Date: 04/06/24    Physical Exam:  General: She is overweight, cooperative, alert, well developed, and in no acute distress. PSYCH: Has normal mood, affect and thought process.   HEENT: EOMI, sclerae are anicteric. Lungs: Normal breathing effort, no conversational dyspnea. Extremities: No edema.   Neurologic: No gross sensory or motor deficits. No tremors or fasciculations noted.    Diagnostic Data Reviewed  EKG: Normal sinus rhythm, rate 53 bpm. No conduction abnormalities, abnormal Q waves or chamber enlargement.  Indirect Calorimeter completed today shows a VO2 of 226 and a REE of 1555.  Her calculated basal metabolic rate is 8586 thus her resting energy expenditure faster than calculated.  Depression Screen  Mattilynn's PHQ-9 score was: 8.     12/25/2020    8:54 AM  Depression screen PHQ 2/9  Decreased Interest 0  Down, Depressed, Hopeless 0  PHQ - 2 Score 0    Screening for Sleep Related Breathing Disorders  Anthonella admits to daytime somnolence and admits to waking up still tired. Patient has a history of symptoms of morning fatigue. Cleotilde generally gets 8 hours of sleep per night, and states that she has generally restful sleep. Snoring is present. Apneic episodes are not present. Epworth Sleepiness Score is 5.   BMET    Component Value Date/Time   NA 139 06/17/2021 1410   K 3.8 06/17/2021 1410   CL 101 06/17/2021 1410   CO2 26 06/17/2021 1410   GLUCOSE 86 06/17/2021 1410   BUN 27 (H) 06/17/2021 1410   CREATININE 0.84 06/24/2021 1316   CALCIUM 9.5 06/17/2021 1410   GFRNONAA >60 06/24/2021 1316   GFRAA >60 02/02/2019 0240   No results found for: HGBA1C No results found for: INSULIN CBC    Component Value Date/Time   WBC 7.8 06/25/2021 0444   RBC 4.08 06/25/2021 0444   HGB 11.1 (L) 06/25/2021 0444   HCT 34.3 (L) 06/25/2021 0444   PLT 319 06/25/2021 0444   MCV 84.1 06/25/2021 0444   MCH 27.2 06/25/2021 0444   MCHC 32.4 06/25/2021 0444   RDW 16.4 (H) 06/25/2021 0444   Iron/TIBC/Ferritin/ %Sat No results found for: IRON, TIBC, FERRITIN, IRONPCTSAT Lipid Panel  No results found for: CHOL, TRIG, HDL, CHOLHDL, VLDL, LDLCALC, LDLDIRECT Hepatic Function Panel     Component Value Date/Time   PROT 7.5 06/17/2021 1410   ALBUMIN  3.9 06/17/2021 1410   AST 23 06/17/2021 1410   ALT 25 06/17/2021 1410   ALKPHOS 73 06/17/2021 1410   BILITOT 0.1 (L) 06/17/2021 1410   No results found for: TSH   Assessment and Plan   TREATMENT PLAN FOR OBESITY:  Recommended Dietary Goals  Twanda is currently in the action stage of change. As such, her goal is to implement medically supervised obesity management plan.  She has agreed to implement: Patient provided with a custom made AI generated 7-day meal plan targeting 5 small meals, high in protein for Roux-en-Y patients.  Meal plan targeting 1100 cal/day based on indirect calorimetry of 1555  Behavioral Intervention  We discussed the following Behavioral Modification Strategies today: increasing lean protein intake to established goals, decreasing simple carbohydrates ,  increasing vegetables, increasing lower glycemic fruits, increasing fiber rich foods, avoiding skipping meals, increasing water  intake, work on meal planning and preparation, work on tracking and journaling calories using tracking application, reading food labels , keeping healthy foods at home, identifying sources and decreasing liquid calories, decreasing eating out or consumption of processed foods, and making healthy choices when eating convenient foods, planning for success, and better snacking choices  Additional resources provided today: Handout on healthy eating and balanced plate, Handout on complex carbohydrates and lean sources of protein, Category 1100 packet, and Handout principles of weight management  Recommended Physical Activity Goals  Jahleah has been advised to work up to 150 minutes of moderate intensity aerobic activity a week and strengthening exercises 2-3 times per week for cardiovascular health, weight loss maintenance and preservation of muscle mass.   She has agreed to :  Think about enjoyable ways to increase daily physical activity and overcoming barriers to exercise, Increase  physical activity in their day and reduce sedentary time (increase NEAT)., Increase volume of physical activity to a goal of 240 minutes a week, and Combine aerobic and strengthening exercises for efficiency and improved cardiometabolic health.  Medical Interventions and Pharmacotherapy We will work on building a therapist, art and behavioral strategies. We will discuss the role of pharmacotherapy as an adjunct at subsequent visits.   ASSOCIATED CONDITIONS ADDRESSED TODAY  Other Fatigue Jaylnn admits to daytime somnolence and admits to waking up still tired. Patient has a history of symptoms of morning fatigue. Chong generally gets 8 hours of sleep per night, and states that she has generally restful sleep. Snoring is present. Apneic episodes are not present. Epworth Sleepiness Score is 5. SABRA Tilton does feel that her weight is causing her energy to be lower than it should be. Fatigue may be related to obesity, depression or many other causes. Labs will be ordered, and in the meanwhile, Londin will focus on self care including making healthy food choices, increasing physical activity and focusing on stress reduction.  Shortness of Breath Brentlee notes increasing shortness of breath with physical activity and seems to be worsening over time with weight gain. She notes getting out of breath sooner with activity than she used to. This has not gotten worse recently. Maevyn denies shortness of breath at rest or orthopnea.  Assessment & Plan SOB (shortness of breath) on exertion  Depression screen  Other fatigue  Pure hypercholesterolemia Per history.  We will check fasting lipid profile and assess cardiovascular risk. Primary hypertension Intermittent elevated blood pressure readings, particularly in the morning. Previously on two antihypertensive pills, reduced to one due to low blood pressure after weight loss. Importance of morning blood pressure monitoring emphasized  to prevent stroke risk. - Instructed to monitor blood pressure at home, especially in the morning before medication and before bedtime. - Advised to maintain blood pressure goal of less than 130/80 mmHg. - Losing 10% of body weight may reduce the risk Class 1 obesity with serious comorbidity and body mass index (BMI) of 33.0 to 33.9 in adult, unspecified obesity type History of Roux-en-Y gastric bypass 2023 Weight regain after Roux-en-Y gastric bypass Weight regain post-Roux-en-Y gastric bypass with initial weight loss from 244 lbs to 187 lbs, maintained for a year. Gradual weight regain likely due to metabolic changes and potential muscle loss post-surgery. Family history of obesity and genetic factors. Current lifestyle includes limited exercise due to work schedule and dietary habits with high starch intake. No prediabetes or fatty liver disease.  Current metabolic rate is 8499 calories per day. - Created a 7-day meal plan with 1100 calories per day, high protein, and five small meals including three meals and two pre-meal protein snacks. - Encouraged use of AI tools like ChatGPT for meal planning and protein snack ideas. - Advised on increasing meal frequency to prevent metabolic slowdown. - Discussed potential use of GLP-1 agonists (Zepbound, [facility]) for weight loss, pending insurance coverage. - Provided educational handouts on weight management principles and holiday eating strategies. - Ordered blood work including A1c, insulin , cholesterol, and thyroid levels. - Instructed to hold bariatric vitamins for three days before next visit to check vitamin levels.     Follow-up  She was informed of the importance of frequent follow-up visits to maximize her success with intensive lifestyle modifications for her multiple health conditions. She was informed we would discuss her lab results at her next visit unless there is a critical issue that needs to be addressed sooner. Millianna agreed to keep  her next visit at the agreed upon time to discuss these results.  Attestation Statement  This is the patient's intake visit at [facility]. The patient's Health Questionnaire was reviewed at length. Included in the packet: current and past health history, medications, allergies, ROS, gynecologic history (women only), surgical history, family history, social history, weight history, weight loss surgery history (for those that have had weight loss surgery), nutritional evaluation, mood and food questionnaire, PHQ9, Epworth questionnaire, sleep habits questionnaire, patient life and health improvement goals questionnaire. These will all be scanned into the patient's chart under media.   During the visit, I independently reviewed the patient's EKG, previous labs, bioimpedance scale results, and indirect calorimetry results. I used this information to medically tailor a meal plan for the patient that will help her to lose weight and will improve her obesity-related conditions. I performed a medically necessary appropriate examination and/or evaluation. I discussed the assessment and treatment plan with the patient. The patient was provided an opportunity to ask questions and all were answered. The patient agreed with the plan and demonstrated an understanding of the instructions. Labs were ordered at this visit and will be reviewed at the next visit unless critical results need to be addressed immediately. Clinical information was updated and documented in the EMR.   In addition, they received basic education on identification of processed foods and reduction of these, different sources of lean proteins and complex carbohydrates and how to eat balanced by incorporation of whole foods.  Reviewed by clinician on day of visit: allergies, medications, problem list, medical history, surgical history, family history, social history, and previous encounter notes.  I have spent 55 minutes in the care  of the patient today including: 5 minutes before the visit reviewing and preparing the chart. 40 minutes face-to-face assessing and reviewing listed medical problems as outlined in obesity care plan, providing nutritional and behavioral counseling on topics outlined in the obesity care plan, counseling regarding anti-obesity medication as outlined in obesity care plan, independently interpreting test results and goals of care, as described in assessment and plan, reviewing and discussing biometric information and progress, and ordering diagnostics - see orders 10 minutes after the visit updating chart and documentation of encounter.     Assessment and Plan    Lucas Parker, MD

## 2024-04-07 LAB — CBC WITH DIFFERENTIAL/PLATELET
Basophils Absolute: 0 x10E3/uL (ref 0.0–0.2)
Basos: 1 %
EOS (ABSOLUTE): 0 x10E3/uL (ref 0.0–0.4)
Eos: 1 %
Hematocrit: 38.8 % (ref 34.0–46.6)
Hemoglobin: 12.2 g/dL (ref 11.1–15.9)
Immature Grans (Abs): 0 x10E3/uL (ref 0.0–0.1)
Immature Granulocytes: 0 %
Lymphocytes Absolute: 1.5 x10E3/uL (ref 0.7–3.1)
Lymphs: 48 %
MCH: 28.2 pg (ref 26.6–33.0)
MCHC: 31.4 g/dL — ABNORMAL LOW (ref 31.5–35.7)
MCV: 90 fL (ref 79–97)
Monocytes Absolute: 0.2 x10E3/uL (ref 0.1–0.9)
Monocytes: 8 %
Neutrophils Absolute: 1.3 x10E3/uL — ABNORMAL LOW (ref 1.4–7.0)
Neutrophils: 42 %
Platelets: 329 x10E3/uL (ref 150–450)
RBC: 4.32 x10E6/uL (ref 3.77–5.28)
RDW: 13.4 % (ref 11.7–15.4)
WBC: 3.1 x10E3/uL — ABNORMAL LOW (ref 3.4–10.8)

## 2024-04-07 LAB — COMPREHENSIVE METABOLIC PANEL WITH GFR
ALT: 22 IU/L (ref 0–32)
AST: 26 IU/L (ref 0–40)
Albumin: 4.1 g/dL (ref 3.8–4.9)
Alkaline Phosphatase: 92 IU/L (ref 49–135)
BUN/Creatinine Ratio: 14 (ref 9–23)
BUN: 11 mg/dL (ref 6–24)
Bilirubin Total: 0.3 mg/dL (ref 0.0–1.2)
CO2: 24 mmol/L (ref 20–29)
Calcium: 9.3 mg/dL (ref 8.7–10.2)
Chloride: 105 mmol/L (ref 96–106)
Creatinine, Ser: 0.8 mg/dL (ref 0.57–1.00)
Globulin, Total: 2.7 g/dL (ref 1.5–4.5)
Glucose: 76 mg/dL (ref 70–99)
Potassium: 4.2 mmol/L (ref 3.5–5.2)
Sodium: 144 mmol/L (ref 134–144)
Total Protein: 6.8 g/dL (ref 6.0–8.5)
eGFR: 85 mL/min/1.73 (ref >59)

## 2024-04-07 LAB — TSH: TSH: 2.01 u[IU]/mL (ref 0.450–4.500)

## 2024-04-07 LAB — HEMOGLOBIN A1C
Est. average glucose Bld gHb Est-mCnc: 105 mg/dL
Hgb A1c MFr Bld: 5.3 % (ref 4.8–5.6)

## 2024-04-07 LAB — LIPID PANEL WITH LDL/HDL RATIO
Cholesterol, Total: 164 mg/dL (ref 100–199)
HDL: 66 mg/dL (ref >39)
LDL Chol Calc (NIH): 84 mg/dL (ref 0–99)
LDL/HDL Ratio: 1.3 ratio (ref 0.0–3.2)
Triglycerides: 75 mg/dL (ref 0–149)
VLDL Cholesterol Cal: 14 mg/dL (ref 5–40)

## 2024-04-07 LAB — INSULIN, RANDOM: INSULIN: 4.5 u[IU]/mL (ref 2.6–24.9)

## 2024-04-20 ENCOUNTER — Ambulatory Visit (INDEPENDENT_AMBULATORY_CARE_PROVIDER_SITE_OTHER): Admitting: Internal Medicine

## 2024-04-20 ENCOUNTER — Encounter (INDEPENDENT_AMBULATORY_CARE_PROVIDER_SITE_OTHER): Payer: Self-pay | Admitting: Internal Medicine

## 2024-04-20 VITALS — BP 131/87 | HR 70 | Temp 97.9°F | Ht 63.0 in | Wt 183.0 lb

## 2024-04-20 DIAGNOSIS — D709 Neutropenia, unspecified: Secondary | ICD-10-CM | POA: Diagnosis not present

## 2024-04-20 DIAGNOSIS — E66811 Obesity, class 1: Secondary | ICD-10-CM

## 2024-04-20 DIAGNOSIS — E7800 Pure hypercholesterolemia, unspecified: Secondary | ICD-10-CM | POA: Diagnosis not present

## 2024-04-20 DIAGNOSIS — I10 Essential (primary) hypertension: Secondary | ICD-10-CM

## 2024-04-20 DIAGNOSIS — E6609 Other obesity due to excess calories: Secondary | ICD-10-CM

## 2024-04-20 DIAGNOSIS — K909 Intestinal malabsorption, unspecified: Secondary | ICD-10-CM

## 2024-04-20 DIAGNOSIS — Z6833 Body mass index (BMI) 33.0-33.9, adult: Secondary | ICD-10-CM

## 2024-04-20 DIAGNOSIS — Z9884 Bariatric surgery status: Secondary | ICD-10-CM

## 2024-04-20 NOTE — Assessment & Plan Note (Signed)
 Post Roux-en-Y gastric bypass in 2023 with recent weight loss of 5 pounds. Following an 1100 calorie nutrition plan 60% of the time. Body fat percentage decreased from 46-47% to 44%, with gains in muscle mass. Fat mass decreased by 7 pounds. Appetite is manageable with some hunger. Discussed the importance of consistency in dietary changes and the role of exercise in weight management. Explored options for meal planning and recipe ideas to maintain dietary adherence. Discussed potential use of GLP-1 medications, noting insurance coverage limitations and the need for qualifying conditions such as diabetes or cardiovascular events. -Reviewed fasting blood sugar, hemoglobin A1c and fasting insulin  levels.  No signs of glucose metabolic disorders. - Continue 1100 calorie nutrition plan. - Encouraged increased protein intake. - Provided recipe ideas and meal planning resources, including Skinny Taste and Eating Well. - Discussed potential use of GLP-1 medications, noting insurance coverage limitations. - Encouraged checking with insurance regarding coverage for GLP-1 medications. - Encouraged regular physical activity, including strength training and cardiovascular exercise.

## 2024-04-20 NOTE — Progress Notes (Signed)
 Office: [phone]  /  Fax: [phone]  Weight Summary and Body Composition Analysis (BIA)  Vitals Temp: 97.9 F (36.6 C) BP: 131/87 Pulse Rate: 70 SpO2: 100 %   Anthropometric Measurements Height: 5' 3 (1.6 m) Weight: 183 lb (83 kg) BMI (Calculated): 32.43 Weight at Last Visit: 188 lb Weight Lost Since Last Visit: 5 lb Weight Gained Since Last Visit: 0 lb Starting Weight: 188 lb Total Weight Loss (lbs): 5 lb (2.268 kg) Peak Weight: 260 lb   Body Composition  Body Fat %: 44.5 % Fat Mass (lbs): 81.6 lbs Muscle Mass (lbs): 96.4 lbs Total Body Water  (lbs): 78.4 lbs Visceral Fat Rating : 12  The 10-year ASCVD risk score (Arnett DK, et al., 2019) is: 4.7%   RMR: 1555  Today's Visit #: 2  Starting Date: 09/19/23   Subjective   Chief Complaint: Obesity  Interval History Discussed the use of AI scribe software for clinical note transcription with the patient, who gave verbal consent to proceed.  History of Present Illness Tamara Malone is a 58 year old female with hypertension and hypercholesterolemia who presents for medical weight management post Roux-en-Y surgery.  She is here for a follow-up on her medical weight management program after undergoing Roux-en-Y surgery in 2023. Since her last visit, she has lost five pounds. She adheres to an 1100 calorie nutrition plan about 60% of the time, consuming three meals and two snacks daily. Dietary changes include eliminating tea and potato chips and slightly increasing protein intake. She feels 'a little hungry' but finds it manageable.  Her body fat percentage decreased from 46-47% to 44%, and her muscle mass increased.  She is currently on medication for hypertension, having started hydrochlorothiazide  and amlodipine on April 11, 2024, due to previously elevated blood pressure readings. Her blood pressure today was 139/89 mmHg, and she reports higher readings in the morning before taking her  medication.  Her past medical history includes hypertension and hypercholesterolemia. She does not drink coffee and has not taken any anti-inflammatories recently. Her recent lab results show normal thyroid function and optimal glycemic parameters.  Her family history includes high blood pressure. Her cholesterol levels are LDL 84 and HDL 66.     Challenges affecting patient progress: metabolic adaptations associated with metabolic surgery.    Pharmacotherapy for weight management: She is currently taking no anti-obesity medication.   Assessment and Plan   Treatment Plan For Obesity:  Recommended Dietary Goals  Tamara Malone is currently in the action stage of change. As such, her goal is to continue weight management plan. She has agreed to: incorporate prepackaged healthy meals for convenience, incorporate 1-2 meal replacements a day for convenience , and continue current plan  Behavioral Health and Counseling  We discussed the following behavioral modification strategies today: continue to work on maintaining a reduced calorie state, getting the recommended amount of protein, incorporating whole foods, making healthy choices, staying well hydrated and practicing mindfulness when eating. and increase protein intake, fibrous foods (25 grams per day for women, 30 grams for men) and water  to improve satiety and decrease hunger signals. .  Additional education and resources provided today: Handout on traveling and holiday eating strategies  Recommended Physical Activity Goals  Tamara Malone has been advised to work up to 150 minutes of moderate intensity aerobic activity a week and strengthening exercises 2-3 times per week for cardiovascular health, weight loss maintenance and preservation of muscle mass.  She has agreed to :  Think about enjoyable ways to  increase daily physical activity and overcoming barriers to exercise, Increase physical activity in their day and reduce sedentary time  (increase NEAT)., Increase volume of physical activity to a goal of 240 minutes a week, and Combine aerobic and strengthening exercises for efficiency and improved cardiometabolic health.  Medical Interventions and Pharmacotherapy  We discussed various medication options to help Tamara Malone with her weight loss efforts and we both agreed to : Was educated on GLP-1 receptor agonists, their role in managing obesity-related conditions and commonly associated side effects.  She will look into coverage through her insurance company  Associated Conditions Impacted by Obesity Treatment  Assessment & Plan Intestinal malabsorption, unspecified type She has held bariatric vitamin for 3 days we are checking baseline levels please refer to orders.  Resume bariatric vitamin Primary hypertension Vitals:   04/20/24 0800 04/20/24 0929  BP: 139/89 131/87    Blood pressure is not at goal for age and risk category.  On amlodipine and hydrochlorothiazide  without adverse effects.  Most recent renal parameters reviewed which showed stable electrolytes and kidney function.  Continue with weight loss therapy. Losing 10% may improve blood pressure control. Monitor for symptoms of orthostasis while losing weight. Continue current regimen and home monitoring for a goal blood pressure of < 120/80.  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   Her 10 year risk is: The 10-year ASCVD risk score (Arnett DK, et al., 2019) is: 4.7%  Lab Results  Component Value Date   CHOL 164 04/06/2024   HDL 66 04/06/2024   LDLCALC 84 04/06/2024   TRIG 75 04/06/2024    Continue to maintain a  diet low in saturated fats.  Her cardiovascular risk is borderline likely due to elevated blood pressure.  She is working with her primary care team on blood pressure control. 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 Following an 1100 calorie nutrition plan 60% of the time. Body fat percentage decreased from 46-47% to 44%, with gains in muscle mass. Fat mass decreased by 7 pounds. Appetite is manageable with some hunger. Discussed the importance of consistency in dietary changes and the role of exercise in weight management. Explored options for meal planning and recipe ideas to maintain dietary adherence. Discussed potential use of GLP-1 medications, noting insurance coverage limitations and the need for qualifying conditions such as diabetes or cardiovascular events. -Reviewed fasting blood sugar, hemoglobin A1c and fasting insulin  levels.  No signs of glucose metabolic disorders. - Continue 1100 calorie nutrition plan. - Encouraged increased protein intake. - Provided recipe ideas and meal planning resources, including Skinny Taste and Eating Well. - Discussed potential use of GLP-1 medications, noting insurance coverage limitations. - Encouraged checking with insurance regarding coverage for GLP-1 medications. - Encouraged regular physical activity, including strength training and cardiovascular exercise.         Objective   Physical Exam:  Blood pressure 131/87, pulse 70, temperature 97.9 F (36.6 C), height 5' 3 (1.6 m), weight 183 lb (83 kg), SpO2 100%. Body  mass index is 32.42 kg/m.  General: She is overweight, cooperative, alert, well developed, and in no acute distress. PSYCH: Has normal mood, affect and thought process.   HEENT: EOMI, sclerae are anicteric. Lungs: Normal breathing effort,  no conversational dyspnea. Extremities: No edema.  Neurologic: No gross sensory or motor deficits. No tremors or fasciculations noted.    Diagnostic Data Reviewed:  BMET    Component Value Date/Time   NA 144 04/06/2024 0930   K 4.2 04/06/2024 0930   CL 105 04/06/2024 0930   CO2 24 04/06/2024 0930   GLUCOSE 76 04/06/2024 0930   GLUCOSE 86 06/17/2021 1410   BUN 11 04/06/2024 0930   CREATININE 0.80 04/06/2024 0930   CALCIUM 9.3 04/06/2024 0930   GFRNONAA >60 06/24/2021 1316   GFRAA >60 02/02/2019 0240   Lab Results  Component Value Date   HGBA1C 5.3 04/06/2024   Lab Results  Component Value Date   INSULIN  4.5 04/06/2024   Lab Results  Component Value Date   TSH 2.010 04/06/2024   CBC    Component Value Date/Time   WBC 3.1 (L) 04/06/2024 0930   WBC 7.8 06/25/2021 0444   RBC 4.32 04/06/2024 0930   RBC 4.08 06/25/2021 0444   HGB 12.2 04/06/2024 0930   HCT 38.8 04/06/2024 0930   PLT 329 04/06/2024 0930   MCV 90 04/06/2024 0930   MCH 28.2 04/06/2024 0930   MCH 27.2 06/25/2021 0444   MCHC 31.4 (L) 04/06/2024 0930   MCHC 32.4 06/25/2021 0444   RDW 13.4 04/06/2024 0930   Iron Studies No results found for: IRON, TIBC, FERRITIN, IRONPCTSAT Lipid Panel     Component Value Date/Time   CHOL 164 04/06/2024 0930   TRIG 75 04/06/2024 0930   HDL 66 04/06/2024 0930   LDLCALC 84 04/06/2024 0930   Hepatic Function Panel     Component Value Date/Time   PROT 6.8 04/06/2024 0930   ALBUMIN 4.1 04/06/2024 0930   AST 26 04/06/2024 0930   ALT 22 04/06/2024 0930   ALKPHOS 92 04/06/2024 0930   BILITOT 0.3 04/06/2024 0930      Component Value Date/Time   TSH 2.010 04/06/2024 0930   Nutritional No results found for: VD25OH  Medications: Outpatient Encounter Medications as of 04/20/2024  Medication Sig   amLODipine (NORVASC) 5 MG tablet Take 5 mg by mouth daily.   baclofen  (LIORESAL ) 10 MG tablet Take 10 mg by mouth 2 (two) times daily as needed.   Calcium  Citrate-Vitamin D (CELEBRATE CALCIUM CITRATE) 500-12.5 MG-MCG CHEW Chew 500 mg by mouth daily at 12 noon.   hydrochlorothiazide  (HYDRODIURIL ) 12.5 MG tablet Take 12.5 mg by mouth 2 (two) times daily.    Multiple Vitamin (MULTI-VITAMIN) tablet Take 1 tablet by mouth daily.   Multiple Vitamins-Minerals (BARIATRIC MULTIVITAMINS PO) Take by mouth. Bariatric Advantage Ultra Multi with Iron 2 capsules daily   nystatin (MYCOSTATIN/NYSTOP) powder SMARTSIG:Packet(s) Topical Twice Daily   TOPAMAX  100 MG tablet Take 100 mg by mouth. (Patient not taking: Reported on 04/20/2024)   No facility-administered encounter medications on file as of 04/20/2024.     Follow-Up   Return in about 4 weeks (around 05/18/2024) for For Weight Mangement with Dr. Francyne.Tamara Malone She was informed of the importance of frequent follow up visits to maximize her success with intensive lifestyle modifications for her multiple health conditions.  Attestation Statement   Reviewed by clinician on day of visit: allergies, medications, problem list, medical history, surgical history, family history, social history, and previous  encounter notes.     Lucas Parker, MD

## 2024-04-20 NOTE — Assessment & Plan Note (Signed)
 LDL is at goal.   Her 10 year risk is: The 10-year ASCVD risk score (Arnett DK, et al., 2019) is: 4.7%  Lab Results  Component Value Date   CHOL 164 04/06/2024   HDL 66 04/06/2024   LDLCALC 84 04/06/2024   TRIG 75 04/06/2024    Continue to maintain a diet low in saturated fats.  Her cardiovascular risk is borderline likely due to elevated blood pressure.  She is working with her primary care team on blood pressure control.

## 2024-04-20 NOTE — Assessment & Plan Note (Signed)
 Vitals:   04/20/24 0800 04/20/24 0929  BP: 139/89 131/87    Blood pressure is not at goal for age and risk category.  On amlodipine and hydrochlorothiazide  without adverse effects.  Most recent renal parameters reviewed which showed stable electrolytes and kidney function.  Continue with weight loss therapy. Losing 10% may improve blood pressure control. Monitor for symptoms of orthostasis while losing weight. Continue current regimen and home monitoring for a goal blood pressure of < 120/80.  Continue to work with primary care team on medication adjustments

## 2024-04-25 LAB — CBC WITH DIFFERENTIAL/PLATELET
Basophils Absolute: 0 x10E3/uL (ref 0.0–0.2)
Basos: 1 %
EOS (ABSOLUTE): 0 x10E3/uL (ref 0.0–0.4)
Eos: 1 %
Hematocrit: 38.7 % (ref 34.0–46.6)
Hemoglobin: 12.2 g/dL (ref 11.1–15.9)
Immature Grans (Abs): 0 x10E3/uL (ref 0.0–0.1)
Immature Granulocytes: 0 %
Lymphocytes Absolute: 1.6 x10E3/uL (ref 0.7–3.1)
Lymphs: 48 %
MCH: 27.6 pg (ref 26.6–33.0)
MCHC: 31.5 g/dL (ref 31.5–35.7)
MCV: 88 fL (ref 79–97)
Monocytes Absolute: 0.3 x10E3/uL (ref 0.1–0.9)
Monocytes: 9 %
Neutrophils Absolute: 1.4 x10E3/uL (ref 1.4–7.0)
Neutrophils: 41 %
Platelets: 337 x10E3/uL (ref 150–450)
RBC: 4.42 x10E6/uL (ref 3.77–5.28)
RDW: 13.4 % (ref 11.7–15.4)
WBC: 3.3 x10E3/uL — ABNORMAL LOW (ref 3.4–10.8)

## 2024-04-25 LAB — FERRITIN: Ferritin: 55 ng/mL (ref 15–150)

## 2024-04-25 LAB — FOLATE RBC
Folate, Hemolysate: 380 ng/mL
Folate, RBC: 982 ng/mL (ref >498)

## 2024-04-25 LAB — VITAMIN D 25 HYDROXY (VIT D DEFICIENCY, FRACTURES): Vit D, 25-Hydroxy: 33.6 ng/mL (ref 30.0–100.0)

## 2024-04-25 LAB — VITAMIN B12: Vitamin B-12: 674 pg/mL (ref 232–1245)

## 2024-04-25 LAB — VITAMIN B1: Thiamine: 59.1 nmol/L — AB (ref 66.5–200.0)

## 2024-05-23 ENCOUNTER — Ambulatory Visit (INDEPENDENT_AMBULATORY_CARE_PROVIDER_SITE_OTHER): Admitting: Internal Medicine

## 2024-05-23 ENCOUNTER — Encounter (INDEPENDENT_AMBULATORY_CARE_PROVIDER_SITE_OTHER): Payer: Self-pay | Admitting: Internal Medicine

## 2024-05-23 VITALS — BP 139/88 | HR 80 | Temp 97.7°F | Ht 63.0 in | Wt 185.0 lb

## 2024-05-23 DIAGNOSIS — E66811 Obesity, class 1: Secondary | ICD-10-CM

## 2024-05-23 DIAGNOSIS — I10 Essential (primary) hypertension: Secondary | ICD-10-CM

## 2024-05-23 DIAGNOSIS — Z6833 Body mass index (BMI) 33.0-33.9, adult: Secondary | ICD-10-CM

## 2024-05-23 DIAGNOSIS — Z6831 Body mass index (BMI) 31.0-31.9, adult: Secondary | ICD-10-CM | POA: Insufficient documentation

## 2024-05-23 DIAGNOSIS — Z9884 Bariatric surgery status: Secondary | ICD-10-CM | POA: Diagnosis not present

## 2024-05-23 DIAGNOSIS — E6609 Other obesity due to excess calories: Secondary | ICD-10-CM | POA: Insufficient documentation

## 2024-05-23 MED ORDER — TIRZEPATIDE-WEIGHT MANAGEMENT 2.5 MG/0.5ML ~~LOC~~ SOLN: 2.5000 mg | SUBCUTANEOUS | 0 refills | Status: DC

## 2024-05-23 NOTE — Assessment & Plan Note (Signed)
 Class 1 obesity with a body fat percentage of 45%, above the target of under 34%. Weight regain of 2 pounds since last visit. Metabolic adaptations post-Roux-en-Y gastric bypass. Current calorie intake is 1000 calories 50% of the time, with limited exercise due to work schedule. Discussed the importance of maintaining a calorie intake under 1500 calories to prevent weight gain and the need for a 1000 calorie intake for faster weight loss. Emphasized the importance of building lean muscle mass to counteract metabolic adaptations and menopausal changes. Discussed potential use of GLP-1 agonists for weight management, including Zepbound  and Wegovy, with Zepbound  offering higher weight loss potential (22% or more) compared to [facility] (15-17%). Discussed side effects of GLP-1 agonists, including nausea, diarrhea, and constipation, and strategies to manage these side effects. Highlighted the importance of protein intake and strengthening exercises to prevent muscle loss. - Prescribed Zepbound  for weight management, with a monthly subsidy of $250 from [employer]. - Provided handout on managing side effects of GLP-1 medications. - Advised on dietary modifications to increase protein and fiber intake. - Encouraged strengthening exercises to build lean muscle mass. - Scheduled follow-up in 4 weeks to monitor progress and adjust treatment as needed.

## 2024-05-23 NOTE — Assessment & Plan Note (Signed)
 Roux-en-Y gastric bypass with subsequent weight regain. Discussed metabolic adaptations post-surgery and the importance of maintaining a calorie deficit for weight loss. Emphasized the role of protein intake and strengthening exercises in preventing muscle loss and supporting weight management. - Continue monitoring weight and dietary intake. - Encouraged adherence to a calorie-restricted diet and regular exercise.

## 2024-05-23 NOTE — Assessment & Plan Note (Signed)
 Vitals:   05/23/24 1100  BP: 139/88    Blood pressure is not at goal for age and risk category.  On amlodipine and hydrochlorothiazide  without adverse effects.  Most recent renal parameters reviewed which showed stable electrolytes and kidney function.  Continue with weight loss therapy. Losing 10% may improve blood pressure control. Monitor for symptoms of orthostasis while losing weight. Continue current regimen and home monitoring for a goal blood pressure of < 120/80.    She will be starting Zepbound  medication will help with blood pressure control.

## 2024-05-23 NOTE — Progress Notes (Signed)
 "  Office: [phone]  /  Fax: [phone]  Weight Summary and Body Composition Analysis (BIA)  Vitals Temp: 97.7 F (36.5 C) BP: 139/88 Pulse Rate: 80 SpO2: 100 %   Anthropometric Measurements Height: 5' 3 (1.6 m) Weight: 185 lb (83.9 kg) BMI (Calculated): 32.78 Weight at Last Visit: 183 lb Weight Lost Since Last Visit: 0 lb Weight Gained Since Last Visit: 2 lb Starting Weight: 188 lb Total Weight Loss (lbs): 3 lb (1.361 kg) Peak Weight: 260 lb   Body Composition  Body Fat %: 45.2 % Fat Mass (lbs): 83.8 lbs Muscle Mass (lbs): 96.2 lbs Total Body Water  (lbs): 79.2 lbs Visceral Fat Rating : 12    RMR: 1555  Today's Visit #: 3  Starting Date: 09/19/23   Subjective   Chief Complaint: Obesity  Interval History  Discussed the use of AI scribe software for clinical note transcription with the patient, who gave verbal consent to proceed.  History of Present Illness Tamara Malone is a 59 year old female with a history of Roux-en-Y gastric bypass who presents for medical weight management.  She has experienced a weight gain of two pounds since her last office visit. She follows a calorie-reduced nutrition plan of 1000 calories about 50% of the time and has not been exercising due to her work schedule. 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  She underwent Roux-en-Y gastric bypass and began experiencing some weight regain within a year of initial weight loss. Her body fat percentage is currently 45%.  She experiences quick satiety with small portions but sometimes feels hungry between meals, leading to snacking. She has transitioned from [employer] potato chips to healthier options like smart choice popcorn, applesauce, and oranges. She also consumes almonds but is unsure of the appropriate quantity.  She is in the process of  setting up a step tracker received as a gift to monitor her physical activity.     Challenges affecting patient progress: multiple competing priorities and work schedule.  Metabolic adaptations associated with Roux-en-Y   Pharmacotherapy for weight management: She is currently taking no anti-obesity medication and states interest in starting a medication to aid with weight loss citing difficulty with maintaining a reduced calorie state and weight loss.   Assessment and Plan   Treatment Plan For Obesity:  Recommended Dietary Goals  [name] is currently in the action stage of change. As such, her goal is to continue weight management plan. She has agreed to: continue current plan and continue to work on implementation of reduced calorie nutrition plan (RCNP)  Behavioral Health and Counseling  We discussed the following behavioral modification strategies today: continue to work on maintaining a reduced calorie state, getting the recommended amount of protein, incorporating whole foods, making healthy choices, staying well hydrated and practicing mindfulness when eating. and increase protein intake, fibrous foods (25 grams per day for women, 30 grams for men) and water  to improve satiety and decrease hunger signals. .  Additional education and resources provided today: Handout guide to GLP-1 therapy and how to manage and avoid side effects  Recommended Physical Activity Goals  Irving has been advised to work up to 150 minutes of moderate intensity aerobic activity a week and strengthening exercises 2-3 times per week for cardiovascular health, weight loss maintenance and preservation of muscle mass.  She has agreed to :  Think about enjoyable ways to increase daily physical activity and overcoming barriers to exercise, Increase physical activity in their day and reduce sedentary time (increase NEAT)., Increase volume of physical activity to a goal of 240 minutes a week, and Combine aerobic  and strengthening exercises for efficiency and improved cardiometabolic health.  Medical Interventions and Pharmacotherapy  We discussed various medication options to help [name] with her weight loss efforts and we both agreed to : We discussed various medication options to help [name] with her weight loss efforts and we both agreed to starting anti-obesity pharmacotherapy.  In addition to a prescribed reduced calorie nutrition plan (RCNP), behavioral strategies and physical activity, [name] would benefit from pharmacotherapy to assist with abnormal hunger signals, impaired satiety and cravings. This will reduce obesity-related health risks by inducing weight loss, and help reduce food consumption and adherence to [facility]). It may also improve QOL by improving self-confidence and reduce the setbacks associated with metabolic adaptations following Roux-en-Y.  she also has high risk comorbidities associated with her obesity including: Hypertension and Hyperlipidemia. All of these conditions increase her risk for future complications and are either improved or potentially reverse through sustained weight loss.  GLP-1 receptor agonists have been shown in robust clinical trials to: Enhance satiety and delay gastric emptying, resulting in reduced caloric intake Improve insulin  sensitivity and glycemic control Promote clinically meaningful weight loss (>=5-22%) Reduce cardiovascular risk markers, including blood pressure, lipids, and inflammation  Given [name]'s clinical profile--GLP-1 therapy is both indicated and expected to provide multifactorial benefit. The medication's mechanism aligns precisely with the patient's needs and addresses the physiologic underpinnings of weight gain.   After a detailed discussion covering treatment rationale, mechanism of action, expected outcomes, risks, and long-term use considerations, shared decision-making was used to initiate Zepbound  2.5 mg once a week. The  importance of ongoing lifestyle management and long-term adherence was emphasized, as was the potential for weight regain following discontinuation.  The delivery device was demonstrated, and using the teach-back method, Warda successfully demonstrated proper injection technique. Ongoing monitoring and follow-up are planned to assess tolerability, clinical response, and reinforce behavioral strategies.   Associated Conditions Impacted by Obesity Treatment  Assessment & Plan Class 1 obesity due to excess calories with serious comorbidity and body mass index (BMI) of 33.0 to 33.9 in adult Class 1 obesity with a body fat percentage of 45%, above the target of under 34%. Weight regain of 2 pounds since last visit. Metabolic adaptations post-Roux-en-Y gastric bypass. Current calorie intake is 1000 calories 50% of the time, with limited exercise due to work schedule. Discussed the importance of maintaining a calorie intake under 1500 calories to prevent weight gain and the need for a 1000 calorie intake for faster weight loss. Emphasized the importance of building lean muscle mass to counteract metabolic adaptations and menopausal changes. Discussed potential use of GLP-1 agonists for weight management, including Zepbound  and Wegovy, with Zepbound  offering higher weight loss potential (22% or more) compared to [facility] (15-17%). Discussed side effects of GLP-1 agonists, including nausea, diarrhea, and constipation, and strategies to manage these side effects. Highlighted the importance of protein intake and strengthening exercises to prevent muscle loss. - Prescribed Zepbound  for weight management, with a monthly subsidy of $250 from [employer]. - Provided handout on managing side effects of GLP-1 medications. - Advised on dietary modifications to increase protein and fiber intake. - Encouraged strengthening exercises to build lean muscle mass. - Scheduled follow-up in 4 weeks to monitor progress and adjust treatment  as  needed. History of Roux-en-Y gastric bypass Roux-en-Y gastric bypass with subsequent weight regain. Discussed metabolic adaptations post-surgery and the importance of maintaining a calorie deficit for weight loss. Emphasized the role of protein intake and strengthening exercises in preventing muscle loss and supporting weight management. - Continue monitoring weight and dietary intake. - Encouraged adherence to a calorie-restricted diet and regular exercise. Primary hypertension Vitals:   05/23/24 1100  BP: 139/88    Blood pressure is not at goal for age and risk category.  On amlodipine and hydrochlorothiazide  without adverse effects.  Most recent renal parameters reviewed which showed stable electrolytes and kidney function.  Continue with weight loss therapy. Losing 10% may improve blood pressure control. Monitor for symptoms of orthostasis while losing weight. Continue current regimen and home monitoring for a goal blood pressure of < 120/80.    She will be starting Zepbound  medication will help with blood pressure control.          Objective   Physical Exam:  Blood pressure 139/88, pulse 80, temperature 97.7 F (36.5 C), height 5' 3 (1.6 m), weight 185 lb (83.9 kg), SpO2 100%. Body mass index is 32.77 kg/m.  General: She is overweight, cooperative, alert, well developed, and in no acute distress. PSYCH: Has normal mood, affect and thought process.   HEENT: EOMI, sclerae are anicteric. Lungs: Normal breathing effort, no conversational dyspnea. Extremities: No edema.  Neurologic: No gross sensory or motor deficits. No tremors or fasciculations noted.    Diagnostic Data Reviewed:  BMET    Component Value Date/Time   NA 144 04/06/2024 0930   K 4.2 04/06/2024 0930   CL 105 04/06/2024 0930   CO2 24 04/06/2024 0930   GLUCOSE 76 04/06/2024 0930   GLUCOSE 86 06/17/2021 1410   BUN 11 04/06/2024 0930   CREATININE 0.80 04/06/2024 0930   CALCIUM 9.3 04/06/2024 0930    GFRNONAA >60 06/24/2021 1316   GFRAA >60 02/02/2019 0240   Lab Results  Component Value Date   HGBA1C 5.3 04/06/2024   Lab Results  Component Value Date   INSULIN  4.5 04/06/2024   Lab Results  Component Value Date   TSH 2.010 04/06/2024   CBC    Component Value Date/Time   WBC 3.3 (L) 04/20/2024 0938   WBC 7.8 06/25/2021 0444   RBC 4.42 04/20/2024 0938   RBC 4.08 06/25/2021 0444   HGB 12.2 04/20/2024 0938   HCT 38.7 04/20/2024 0938   PLT 337 04/20/2024 0938   MCV 88 04/20/2024 0938   MCH 27.6 04/20/2024 0938   MCH 27.2 06/25/2021 0444   MCHC 31.5 04/20/2024 0938   MCHC 32.4 06/25/2021 0444   RDW 13.4 04/20/2024 0938   Iron Studies    Component Value Date/Time   FERRITIN 55 04/20/2024 0938   Lipid Panel     Component Value Date/Time   CHOL 164 04/06/2024 0930   TRIG 75 04/06/2024 0930   HDL 66 04/06/2024 0930   LDLCALC 84 04/06/2024 0930   Hepatic Function Panel     Component Value Date/Time   PROT 6.8 04/06/2024 0930   ALBUMIN 4.1 04/06/2024 0930   AST 26 04/06/2024 0930   ALT 22 04/06/2024 0930   ALKPHOS 92 04/06/2024 0930   BILITOT 0.3 04/06/2024 0930      Component Value Date/Time   TSH 2.010 04/06/2024 0930   Nutritional Lab Results  Component Value Date   VD25OH 33.6 04/20/2024    Medications: Outpatient Encounter Medications as of 05/23/2024  Medication Sig  amLODipine (NORVASC) 5 MG tablet Take 5 mg by mouth daily.   baclofen  (LIORESAL ) 10 MG tablet Take 10 mg by mouth 2 (two) times daily as needed.   Calcium Citrate-Vitamin D  (CELEBRATE CALCIUM CITRATE) 500-12.5 MG-MCG CHEW Chew 500 mg by mouth daily at 12 noon.   hydrochlorothiazide  (HYDRODIURIL ) 12.5 MG tablet Take 12.5 mg by mouth 2 (two) times daily.    Multiple Vitamin (MULTI-VITAMIN) tablet Take 1 tablet by mouth daily.   Multiple Vitamins-Minerals (BARIATRIC MULTIVITAMINS PO) Take by mouth. Bariatric Advantage Ultra Multi with Iron 2 capsules daily   nystatin (MYCOSTATIN/NYSTOP)  powder SMARTSIG:Packet(s) Topical Twice Daily   tirzepatide  (ZEPBOUND ) 2.5 MG/0.5ML injection vial Inject 2.5 mg into the skin once a week.   TOPAMAX  100 MG tablet Take 100 mg by mouth.   No facility-administered encounter medications on file as of 05/23/2024.     Follow-Up   Return in about 4 weeks (around 06/20/2024) for For Weight Mangement with Dr. Francyne.SABRA She was informed of the importance of frequent follow up visits to maximize her success with intensive lifestyle modifications for her multiple health conditions.  Attestation Statement   Reviewed by clinician on day of visit: allergies, medications, problem list, medical history, surgical history, family history, social history, and previous encounter notes.     Lucas Francyne, MD  "

## 2024-06-21 ENCOUNTER — Encounter (INDEPENDENT_AMBULATORY_CARE_PROVIDER_SITE_OTHER): Payer: Self-pay | Admitting: Internal Medicine

## 2024-06-21 ENCOUNTER — Ambulatory Visit (INDEPENDENT_AMBULATORY_CARE_PROVIDER_SITE_OTHER): Admitting: Internal Medicine

## 2024-06-21 VITALS — BP 130/80 | HR 71 | Temp 97.7°F | Ht 63.0 in | Wt 175.0 lb

## 2024-06-21 DIAGNOSIS — I10 Essential (primary) hypertension: Secondary | ICD-10-CM | POA: Diagnosis not present

## 2024-06-21 DIAGNOSIS — E7800 Pure hypercholesterolemia, unspecified: Secondary | ICD-10-CM

## 2024-06-21 DIAGNOSIS — E66811 Obesity, class 1: Secondary | ICD-10-CM

## 2024-06-21 DIAGNOSIS — Z9884 Bariatric surgery status: Secondary | ICD-10-CM

## 2024-06-21 DIAGNOSIS — Z6831 Body mass index (BMI) 31.0-31.9, adult: Secondary | ICD-10-CM

## 2024-06-21 MED ORDER — TIRZEPATIDE-WEIGHT MANAGEMENT 2.5 MG/0.5ML ~~LOC~~ SOLN: 2.5000 mg | SUBCUTANEOUS | 0 refills | Status: AC

## 2024-06-21 NOTE — Assessment & Plan Note (Signed)
 LDL is at goal.   Her 10 year risk is: The 10-year ASCVD risk score (Arnett DK, et al., 2019) is: 4.6%  Lab Results  Component Value Date   CHOL 164 04/06/2024   HDL 66 04/06/2024   LDLCALC 84 04/06/2024   TRIG 75 04/06/2024    Continue to maintain a diet low in saturated fats.  Her cardiovascular risk is borderline likely due to elevated blood pressure.  She is working with her primary care team on blood pressure control.

## 2024-06-21 NOTE — Assessment & Plan Note (Signed)
 Vitals:   06/21/24 0800  BP: 130/80   Stable. Blood pressure control expected to improve with continued weight reduction. No medication changes indicated today; continue current antihypertensive regimen and home BP monitoring.

## 2024-07-19 ENCOUNTER — Ambulatory Visit (INDEPENDENT_AMBULATORY_CARE_PROVIDER_SITE_OTHER): Admitting: Internal Medicine
